# Patient Record
Sex: Female | Born: 1995 | Race: Black or African American | Hispanic: No | Marital: Single | Smoking: Never smoker
Health system: Southern US, Community
[De-identification: ages and names within clinical notes are randomized; demographics above are authoritative.]

## PROBLEM LIST (undated history)

## (undated) DIAGNOSIS — N946 Dysmenorrhea, unspecified: Secondary | ICD-10-CM

## (undated) DIAGNOSIS — Z8781 Personal history of (healed) traumatic fracture: Secondary | ICD-10-CM

## (undated) DIAGNOSIS — F419 Anxiety disorder, unspecified: Secondary | ICD-10-CM

## (undated) DIAGNOSIS — D649 Anemia, unspecified: Secondary | ICD-10-CM

## (undated) DIAGNOSIS — G43909 Migraine, unspecified, not intractable, without status migrainosus: Secondary | ICD-10-CM

## (undated) HISTORY — DX: Migraine, unspecified, not intractable, without status migrainosus: G43.909

## (undated) HISTORY — DX: Personal history of (healed) traumatic fracture: Z87.81

## (undated) HISTORY — DX: Dysmenorrhea, unspecified: N94.6

## (undated) HISTORY — DX: Anxiety disorder, unspecified: F41.9

## (undated) HISTORY — DX: Anemia, unspecified: D64.9

---
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
## 2013-01-04 ENCOUNTER — Ambulatory Visit: Payer: Medicaid Other

## 2013-01-06 ENCOUNTER — Ambulatory Visit: Payer: Medicaid Other

## 2013-01-11 ENCOUNTER — Ambulatory Visit: Payer: Medicaid Other

## 2013-01-13 ENCOUNTER — Ambulatory Visit: Payer: Medicaid Other

## 2013-01-18 ENCOUNTER — Ambulatory Visit: Payer: Medicaid Other

## 2013-01-20 ENCOUNTER — Ambulatory Visit: Payer: Medicaid Other

## 2013-01-25 ENCOUNTER — Ambulatory Visit: Payer: Medicaid Other

## 2013-01-27 ENCOUNTER — Ambulatory Visit: Payer: Medicaid Other

## 2013-06-27 ENCOUNTER — Encounter (HOSPITAL_COMMUNITY): Payer: Self-pay

## 2013-06-27 ENCOUNTER — Emergency Department (HOSPITAL_COMMUNITY): Payer: Medicaid Other

## 2013-06-27 ENCOUNTER — Emergency Department (HOSPITAL_COMMUNITY)
Admission: EM | Admit: 2013-06-27 | Discharge: 2013-06-27 | Discharge status: Against Medical Advice | Payer: Medicaid Other | Attending: Emergency Medicine | Admitting: Emergency Medicine

## 2013-06-27 DIAGNOSIS — R0602 Shortness of breath: Secondary | ICD-10-CM | POA: Insufficient documentation

## 2013-06-27 DIAGNOSIS — R079 Chest pain, unspecified: Secondary | ICD-10-CM | POA: Insufficient documentation

## 2013-06-27 LAB — BASIC METABOLIC PANEL
BUN: 14 mg/dL (ref 6–23)
CO2: 29 mEq/L (ref 19–32)
Calcium: 9.7 mg/dL (ref 8.4–10.5)
Chloride: 103 mEq/L (ref 96–112)
Creatinine, Ser: 0.73 mg/dL (ref 0.50–1.10)
GFR calc Af Amer: >90 mL/min (ref >90)
GFR calc non Af Amer: >90 mL/min (ref >90)
Glucose, Bld: 87 mg/dL (ref 70–99)
Potassium: 3.6 mEq/L (ref 3.5–5.1)
Sodium: 140 mEq/L (ref 135–145)

## 2013-06-27 LAB — POCT I-STAT TROPONIN I: Troponin i, poc: 0 ng/mL (ref 0.00–0.08)

## 2013-06-27 LAB — CBC
MCV: 91.2 fL (ref 78.0–100.0)
Platelets: 289 10*3/uL (ref 150–400)
RBC: 4.34 MIL/uL (ref 3.87–5.11)
RDW: 12 % (ref 11.5–15.5)
WBC: 8.7 10*3/uL (ref 4.0–10.5)

## 2013-06-27 NOTE — ED Notes (Signed)
Pt c/o Left side chest pain and difficulty breathing starting today. Pt denies N/V/D, cough, congestion, abd/back pain, or dizziness

## 2013-06-27 NOTE — ED Notes (Signed)
Called for pt. In lobby x 2 no response.

## 2013-07-29 HISTORY — PX: WISDOM TOOTH EXTRACTION: SHX21

## 2013-10-19 ENCOUNTER — Ambulatory Visit (INDEPENDENT_AMBULATORY_CARE_PROVIDER_SITE_OTHER): Payer: BC Managed Care – PPO | Admitting: Obstetrics and Gynecology

## 2013-10-19 ENCOUNTER — Encounter: Payer: Self-pay | Admitting: Obstetrics and Gynecology

## 2013-10-19 VITALS — BP 100/60 | HR 72 | Resp 16 | Ht 64.0 in | Wt 164.0 lb

## 2013-10-19 DIAGNOSIS — Z01419 Encounter for gynecological examination (general) (routine) without abnormal findings: Secondary | ICD-10-CM

## 2013-10-19 DIAGNOSIS — Z113 Encounter for screening for infections with a predominantly sexual mode of transmission: Secondary | ICD-10-CM

## 2013-10-19 DIAGNOSIS — N63 Unspecified lump in unspecified breast: Secondary | ICD-10-CM

## 2013-10-19 DIAGNOSIS — Z0000 Encounter for general adult medical examination without abnormal findings: Secondary | ICD-10-CM

## 2013-10-19 DIAGNOSIS — N6310 Unspecified lump in the right breast, unspecified quadrant: Secondary | ICD-10-CM

## 2013-10-19 DIAGNOSIS — N390 Urinary tract infection, site not specified: Secondary | ICD-10-CM

## 2013-10-19 LAB — POCT URINALYSIS DIPSTICK
Nitrite, UA: NEGATIVE
Urobilinogen, UA: NEGATIVE

## 2013-10-19 LAB — HEMOGLOBIN, FINGERSTICK: Hemoglobin, fingerstick: 12.6 g/dL (ref 12.0–16.0)

## 2013-10-19 MED ORDER — NORELGESTROMIN-ETH ESTRADIOL 150-35 MCG/24HR TD PTWK: 1.0000 | MEDICATED_PATCH | TRANSDERMAL | Status: DC

## 2013-10-19 MED ORDER — SULFAMETHOXAZOLE-TRIMETHOPRIM 800-160 MG PO TABS: 1.0000 | ORAL_TABLET | Freq: Two times a day (BID) | ORAL | Status: DC

## 2013-10-19 NOTE — Progress Notes (Signed)
GYNECOLOGY VISIT  PCP: none  Referring provider:   HPI: 17 y.o.   Married  [place]  female   [id number] with Patient's last menstrual period was 09/29/2013.   here for   Annual Exam, Discuss Contraception, Occ Pelvic Pain and Pressure with urination. Occurred a couple of weeks ago and comes and goes.   No known urinary tract infections in the past.   Uses condoms for contraception. No STD testing done in past.    Forgot OCPs in the past.   Wants to try the Ortho Evra patch.    Hgb:  13.1 Urine:  Pos, trace protein, trace leukocytes, blood 1+   GYNECOLOGIC HISTORY: Patient's last menstrual period was 09/29/2013. Sexually active:  yes Partner preference: female Contraception: Condoms   Menopausal hormone therapy: no DES exposure:   no Blood transfusions:   no Sexually transmitted diseases:   no GYN Procedures:  no Mammogram:     never            Pap:   never History of abnormal pap smear:  no   OB History   Grav Para Term Preterm Abortions TAB SAB Ect Mult Living   0 0 0 0 0 0 0 0 0 0        LIFESTYLE: Exercise:  Walking 5-7 days a week             Tobacco: no  Alcohol: no Drug use:  occ marijuana use   OTHER HEALTH MAINTENANCE: Tetanus/TDap:  Less than 10 years Gardisil: has had all three Gardasil injections Influenza:  no Zostavax:no  Bone density: no Colonoscopy:no  Cholesterol check: no  Family History  Problem Relation Age of Onset  . Migraines Mother   . Stroke Paternal Grandmother     There are no active problems to display for this patient.  Past Medical History  Diagnosis Date  . Anemia   . Dysmenorrhea   . Anxiety   . History of broken nose     hairline fracture    Past Surgical History  Procedure Laterality Date  . Wisdom tooth extraction  07/2013    ALLERGIES: Review of patient's allergies indicates no known allergies.  Current Outpatient Prescriptions  Medication Sig Dispense Refill  . MELOXICAM PO Take by mouth as  needed.      . Multiple Vitamin (MULTI-VITAMIN DAILY PO) Take by mouth daily.       No current facility-administered medications for this visit.     ROS:  Pertinent items are noted in HPI.  SOCIAL HISTORY:  Conservation officer, nature. Press photographer.  Wants to study nutrition.  Wants to develop her own food line.  PHYSICAL EXAMINATION:    BP 100/60  Pulse 72  Resp 16  Ht 5\' 4"  (1.626 m)  Wt 164 lb (74.39 kg)  BMI 28.14 kg/m2  LMP 09/29/2013   Wt Readings from Last 3 Encounters:  10/19/13 164 lb (74.39 kg) (90%*, Z = 1.30)   * Growth percentiles are based on CDC 2-20 Years data.     Ht Readings from Last 3 Encounters:  10/19/13 5\' 4"  (1.626 m) (46%*, Z = -0.10)   * Growth percentiles are based on CDC 2-20 Years data.    General appearance: alert, cooperative and appears stated age Head: Normocephalic, without obvious abnormality, atraumatic Neck: no adenopathy, supple, symmetrical, trachea midline and thyroid not enlarged, symmetric, no tenderness/mass/nodules Lungs: clear to auscultation bilaterally Breasts: Inspection negative, No nipple retraction or dimpling, No nipple discharge or bleeding, No axillary or  supraclavicular adenopathy, Normal to palpation without dominant masses Heart: regular rate and rhythm Abdomen: soft, non-tender; no masses,  no organomegaly Extremities: extremities normal, atraumatic, no cyanosis or edema Skin: Skin color, texture, turgor normal. No rashes or lesions Lymph nodes: Cervical, supraclavicular, and axillary nodes normal. No abnormal inguinal nodes palpated Neurologic: Grossly normal  Pelvic: External genitalia:  no lesions              Urethra:  normal appearing urethra with no masses, tenderness or lesions              Bartholins and Skenes: normal                 Vagina: normal appearing vagina with normal color and discharge, no lesions              Cervix: normal appearance              Pap and high risk HPV testing done: no.            Bimanual  Exam:  Uterus:  uterus is normal size, shape, consistency and nontender                                      Adnexa: normal adnexa in size, nontender and no masses                                      ASSESSMENT  Right breast lump UTI Need for STD check  PLAN  Breast recheck in 6 weeks. Pap smear age 49 years old  Bactrim DS po bid for 3 days. Follow up urine culture. STD testing today. Start Ortho Evra.  See Epic orders.  Educated in use and side effects.  Breast recheck in 6 weeks. Return annually or prn   An After Visit Summary was printed and given to the patient.

## 2013-10-19 NOTE — Patient Instructions (Signed)
EXERCISE AND DIET:  We recommended that you start or continue a regular exercise program for good health. Regular exercise means any activity that makes your heart beat faster and makes you sweat.  We recommend exercising at least 30 minutes per day at least 3 days a week, preferably 4 or 5.  We also recommend a diet low in fat and sugar.  Inactivity, poor dietary choices and obesity can cause diabetes, heart attack, stroke, and kidney damage, among others.    ALCOHOL AND SMOKING:  Women should limit their alcohol intake to no more than 7 drinks/beers/glasses of wine (combined, not each!) per week. Moderation of alcohol intake to this level decreases your risk of breast cancer and liver damage. And of course, no recreational drugs are part of a healthy lifestyle.  And absolutely no smoking or even second hand smoke. Most people know smoking can cause heart and lung diseases, but did you know it also contributes to weakening of your bones? Aging of your skin?  Yellowing of your teeth and nails?  CALCIUM AND VITAMIN D:  Adequate intake of calcium and Vitamin D are recommended.  The recommendations for exact amounts of these supplements seem to change often, but generally speaking 600 mg of calcium (either carbonate or citrate) and 800 units of Vitamin D per day seems prudent. Certain women may benefit from higher intake of Vitamin D.  If you are among these women, your doctor will have told you during your visit.    PAP SMEARS:  Pap smears, to check for cervical cancer or precancers,  have traditionally been done yearly, although recent scientific advances have shown that most women can have pap smears less often.  However, every woman still should have a physical exam from her gynecologist every year. It will include a breast check, inspection of the vulva and vagina to check for abnormal growths or skin changes, a visual exam of the cervix, and then an exam to evaluate the size and shape of the uterus and  ovaries.  And after 19 years of age, a rectal exam is indicated to check for rectal cancers. We will also provide age appropriate advice regarding health maintenance, like when you should have certain vaccines, screening for sexually transmitted diseases, bone density testing, colonoscopy, mammograms, etc.   MAMMOGRAMS:  All women over 19 years old should have a yearly mammogram. Many facilities now offer a "3D" mammogram, which may cost around $50 extra out of pocket. If possible,  we recommend you accept the option to have the 3D mammogram performed.  It both reduces the number of women who will be called back for extra views which then turn out to be normal, and it is better than the routine mammogram at detecting truly abnormal areas.    COLONOSCOPY:  Colonoscopy to screen for colon cancer is recommended for all women at age 19.  We know, you hate the idea of the prep.  We agree, BUT, having colon cancer and not knowing it is worse!!  Colon cancer so often starts as a polyp that can be seen and removed at colonscopy, which can quite literally save your life!  And if your first colonoscopy is normal and you have no family history of colon cancer, most women don't have to have it again for 10 years.  Once every ten years, you can do something that may end up saving your life, right?  We will be happy to help you get it scheduled when you are ready.    Be sure to check your insurance coverage so you understand how much it will cost.  It may be covered as a preventative service at no cost, but you should check your particular policy.     Ethinyl Estradiol; Norelgestromin skin patches What is this medicine? ETHINYL ESTRADIOL;NORELGESTROMIN (ETH in il es tra DYE ole; nor el JES troe min) skin patch is used as a contraceptive (birth control method). This medicine combines two types of female hormones, an estrogen and a progestin. This patch is used to prevent ovulation and pregnancy. This medicine may be used  for other purposes; ask your health care provider or pharmacist if you have questions. What should I tell my health care provider before I take this medicine? They need to know if you have or ever had any of these conditions: -abnormal vaginal bleeding -blood vessel disease or blood clots -breast, cervical, endometrial, ovarian, liver, or uterine cancer -diabetes -gallbladder disease -heart disease or recent heart attack -high blood pressure -high cholesterol -kidney disease -liver disease -migraine headaches -stroke -systemic lupus erythematosus (SLE) -tobacco smoker -an unusual or allergic reaction to estrogens, progestins, other medicines, foods, dyes, or preservatives -pregnant or trying to get pregnant -breast-feeding How should I use this medicine? This patch is applied to the skin. Follow the directions on the prescription label. Apply to clean, dry, healthy skin on the buttock, abdomen, upper outer arm or upper torso, in a place where it will not be rubbed by tight clothing. Do not use lotions or other cosmetics on the site where the patch will go. Press the patch firmly in place for 10 seconds to ensure good contact with the skin. Change the patch every 7 days on the same day of the week for 3 weeks. You will then have a break from the patch for 1 week, after which you will apply a new patch. Do not use your medicine more often than directed. Contact your pediatrician regarding the use of this medicine in children. Special care may be needed. This medicine has been used in female children who have started having menstrual periods. A patient package insert for the product will be given with each prescription and refill. Read this sheet carefully each time. The sheet may change frequently. Overdosage: If you think you have taken too much of this medicine contact a poison control center or emergency room at once. NOTE: This medicine is only for you. Do not share this medicine with  others. What if I miss a dose? You will need to replace your patch once a week as directed. If your patch is lost or falls off, contact your health care professional for advice. You may need to use another form of birth control if your patch has been off for more than 1 day. What may interact with this medicine? -acetaminophen -antibiotics or medicines for infections, especially rifampin, rifabutin, rifapentine, and griseofulvin, and possibly penicillins or tetracyclines -aprepitant -ascorbic acid (vitamin C) -atorvastatin -barbiturate medicines, such as phenobarbital -bosentan -carbamazepine -caffeine -clofibrate -cyclosporine -dantrolene -doxercalciferol -felbamate -grapefruit juice -hydrocortisone -medicines for anxiety or sleeping problems, such as diazepam or temazepam -medicines for diabetes, including pioglitazone -modafinil -mycophenolate -nefazodone -oxcarbazepine -phenytoin -prednisolone -ritonavir or other medicines for HIV infection or AIDS -rosuvastatin -selegiline -soy isoflavones supplements -St. John's wort -tamoxifen or raloxifene -theophylline -thyroid hormones -topiramate -warfarin This list may not describe all possible interactions. Give your health care provider a list of all the medicines, herbs, non-prescription drugs, or dietary supplements you use. Also tell them if you  smoke, drink alcohol, or use illegal drugs. Some items may interact with your medicine. What should I watch for while using this medicine? Visit your doctor or health care professional for regular checks on your progress. You will need a regular breast and pelvic exam and Pap smear while on this medicine. Use an additional method of contraception during the first cycle that you use this patch. If you have any reason to think you are pregnant, stop using this medicine right away and contact your doctor or health care professional. If you are using this medicine for hormone related  problems, it may take several cycles of use to see improvement in your condition. Smoking increases the risk of getting a blood clot or having a stroke while you are using hormonal birth control, especially if you are more than 19 years old. You are strongly advised not to smoke. This medicine can make your body retain fluid, making your fingers, hands, or ankles swell. Your blood pressure can go up. Contact your doctor or health care professional if you feel you are retaining fluid. This medicine can make you more sensitive to the sun. Keep out of the sun. If you cannot avoid being in the sun, wear protective clothing and use sunscreen. Do not use sun lamps or tanning beds/booths. If you wear contact lenses and notice visual changes, or if the lenses begin to feel uncomfortable, consult your eye care specialist. In some women, tenderness, swelling, or minor bleeding of the gums may occur. Notify your dentist if this happens. Brushing and flossing your teeth regularly may help limit this. See your dentist regularly and inform your dentist of the medicines you are taking. If you are going to have elective surgery or a MRI, you may need to stop using this medicine before the surgery or MRI. Consult your health care professional for advice. This medicine does not protect you against HIV infection (AIDS) or any other sexually transmitted diseases. What side effects may I notice from receiving this medicine? Side effects that you should report to your doctor or health care professional as soon as possible: -breast tissue changes or discharge -changes in vaginal bleeding during your period or between your periods -chest pain -coughing up blood -dizziness or fainting spells -headaches or migraines -leg, arm or groin pain -severe or sudden headaches -stomach pain (severe) -sudden shortness of breath -sudden loss of coordination, especially on one side of the body -speech problems -symptoms of vaginal  infection like itching, irritation or unusual discharge -tenderness in the upper abdomen -vomiting -weakness or numbness in the arms or legs, especially on one side of the body -yellowing of the eyes or skin Side effects that usually do not require medical attention (report to your doctor or health care professional if they continue or are bothersome): -breakthrough bleeding and spotting that continues beyond the 3 initial cycles of pills -breast tenderness -mood changes, anxiety, depression, frustration, anger, or emotional outbursts -increased sensitivity to sun or ultraviolet light -nausea -skin rash, acne, or brown spots on the skin -weight gain (slight) This list may not describe all possible side effects. Call your doctor for medical advice about side effects. You may report side effects to FDA at 1-800-FDA-1088. Where should I keep my medicine? Keep out of the reach of children. Store at room temperature between 15 and 30 degrees C (59 and 86 degrees F). Keep the patch in its pouch until time of use. Throw away any unused medicine after the expiration date. Dispose of  used patches properly. Since a used patch may still contain active hormones, fold the patch in half so that it sticks to itself prior to disposal. Throw away in a place where children or pets cannot reach. NOTE: This sheet is a summary. It may not cover all possible information. If you have questions about this medicine, talk to your doctor, pharmacist, or health care provider.  2013, [employer]. (11/30/2008 12:06:24 PM)

## 2013-10-20 LAB — STD PANEL

## 2013-10-20 NOTE — Addendum Note (Signed)
Addended by: Alphonsa Overall on: 10/20/2013 08:34 AM   Modules accepted: Orders

## 2013-10-22 LAB — URINE CULTURE

## 2013-10-27 ENCOUNTER — Other Ambulatory Visit: Payer: Self-pay | Admitting: Obstetrics and Gynecology

## 2013-10-27 ENCOUNTER — Other Ambulatory Visit: Payer: Self-pay

## 2013-10-27 DIAGNOSIS — Z113 Encounter for screening for infections with a predominantly sexual mode of transmission: Secondary | ICD-10-CM

## 2013-10-27 DIAGNOSIS — Z202 Contact with and (suspected) exposure to infections with a predominantly sexual mode of transmission: Secondary | ICD-10-CM

## 2013-10-31 ENCOUNTER — Other Ambulatory Visit: Payer: BC Managed Care – PPO

## 2013-10-31 ENCOUNTER — Telehealth: Payer: Self-pay | Admitting: Obstetrics and Gynecology

## 2013-10-31 NOTE — Telephone Encounter (Signed)
Alejandra Martinez,  Patient [facility] appointment for GC/CT. We do not need to pursue this further.

## 2013-10-31 NOTE — Telephone Encounter (Signed)
Left message for pt to reschedule missed lab appt from this afternoon.

## 2013-11-03 ENCOUNTER — Other Ambulatory Visit: Payer: Self-pay

## 2013-12-02 ENCOUNTER — Ambulatory Visit (INDEPENDENT_AMBULATORY_CARE_PROVIDER_SITE_OTHER): Payer: BC Managed Care – PPO | Admitting: Obstetrics and Gynecology

## 2013-12-02 ENCOUNTER — Encounter: Payer: Self-pay | Admitting: Obstetrics and Gynecology

## 2013-12-02 VITALS — BP 110/60 | HR 60 | Ht 64.0 in | Wt 168.0 lb

## 2013-12-02 DIAGNOSIS — N63 Unspecified lump in unspecified breast: Secondary | ICD-10-CM

## 2013-12-02 DIAGNOSIS — Z113 Encounter for screening for infections with a predominantly sexual mode of transmission: Secondary | ICD-10-CM

## 2013-12-02 DIAGNOSIS — N390 Urinary tract infection, site not specified: Secondary | ICD-10-CM

## 2013-12-02 DIAGNOSIS — N6310 Unspecified lump in the right breast, unspecified quadrant: Secondary | ICD-10-CM

## 2013-12-02 NOTE — Progress Notes (Signed)
Patient ID: Alejandra Martinez, female   DOB: Aug 21, 1996, 17 y.o.   MRN: [id number]  Subjective  Patient is here for a recheck of UTI, to do GC/CT testing, and a right breast recheck.   Feels better after taking Bactrim.  Had E coli UTI on 10/19/13. No painful urination now.   Objective  Breast exam - right breast - palpable mass at 6 - 7 o'clock approx 0.75 - 1 cm, nontender.  No retractions, axillary adenopathy.                         Left breast - no masses, retractions, or axillary adenopathy.   Assessment  Persistent breast mass.  I suspect fibrocystic change. Recent UTI. Desire for STD testing to be completed.  Plan  Ultrasound of breast at [facility]. Urine culture for test of cure.  Urine GC/CT.   15 minutes face to face time of which over 50% was spent in counseling.

## 2013-12-02 NOTE — Progress Notes (Signed)
R Breast U/S Scheduled at visit 12/17 at 9 am. Patient agreeable to time/date/location for [facility] imaging

## 2013-12-04 ENCOUNTER — Encounter: Payer: Self-pay | Admitting: Obstetrics and Gynecology

## 2013-12-05 LAB — CULTURE, URINE COMPREHENSIVE: Colony Count: 50000

## 2013-12-07 ENCOUNTER — Other Ambulatory Visit: Payer: Medicaid Other

## 2013-12-14 ENCOUNTER — Ambulatory Visit
Admission: RE | Admit: 2013-12-14 | Discharge: 2013-12-14 | Disposition: A | Discharge status: Home / Self Care | Payer: BC Managed Care – PPO | Source: Ambulatory Visit | Attending: Obstetrics and Gynecology | Admitting: Obstetrics and Gynecology

## 2013-12-14 DIAGNOSIS — N6310 Unspecified lump in the right breast, unspecified quadrant: Secondary | ICD-10-CM

## 2014-04-18 ENCOUNTER — Emergency Department (INDEPENDENT_AMBULATORY_CARE_PROVIDER_SITE_OTHER)
Admission: EM | Admit: 2014-04-18 | Discharge: 2014-04-18 | Disposition: A | Discharge status: Home / Self Care | Payer: BC Managed Care – PPO | Source: Home / Self Care

## 2014-04-18 ENCOUNTER — Encounter (HOSPITAL_COMMUNITY): Payer: Self-pay | Admitting: Emergency Medicine

## 2014-04-18 ENCOUNTER — Emergency Department (INDEPENDENT_AMBULATORY_CARE_PROVIDER_SITE_OTHER): Payer: BC Managed Care – PPO

## 2014-04-18 DIAGNOSIS — S93609A Unspecified sprain of unspecified foot, initial encounter: Secondary | ICD-10-CM

## 2014-04-18 DIAGNOSIS — M79671 Pain in right foot: Secondary | ICD-10-CM

## 2014-04-18 DIAGNOSIS — M79609 Pain in unspecified limb: Secondary | ICD-10-CM

## 2014-04-18 DIAGNOSIS — S93601A Unspecified sprain of right foot, initial encounter: Secondary | ICD-10-CM

## 2014-04-18 NOTE — Discharge Instructions (Signed)
Foot Sprain The muscles and cord like structures which attach muscle to bone (tendons) that surround the feet are made up of units. A foot sprain can occur at the weakest spot in any of these units. This condition is most often caused by injury to or overuse of the foot, as from playing contact sports, or aggravating a previous injury, or from poor conditioning, or obesity. SYMPTOMS  Pain with movement of the foot.  Tenderness and swelling at the injury site.  Loss of strength is present in moderate or severe sprains. THE THREE GRADES OR SEVERITY OF FOOT SPRAIN ARE:  Mild (Grade I): Slightly pulled muscle without tearing of muscle or tendon fibers or loss of strength.  Moderate (Grade II): Tearing of fibers in a muscle, tendon, or at the attachment to bone, with small decrease in strength.  Severe (Grade III): Rupture of the muscle-tendon-bone attachment, with separation of fibers. Severe sprain requires surgical repair. Often repeating (chronic) sprains are caused by overuse. Sudden (acute) sprains are caused by direct injury or over-use. DIAGNOSIS  Diagnosis of this condition is usually by your own observation. If problems continue, a caregiver may be required for further evaluation and treatment. X-rays may be required to make sure there are not breaks in the bones (fractures) present. Continued problems may require physical therapy for treatment. PREVENTION  Use strength and conditioning exercises appropriate for your sport.  Warm up properly prior to working out.  Use athletic shoes that are made for the sport you are participating in.  Allow adequate time for healing. Early return to activities makes repeat injury more likely, and can lead to an unstable arthritic foot that can result in prolonged disability. Mild sprains generally heal in 3 to 10 days, with moderate and severe sprains taking 2 to 10 weeks. Your caregiver can help you determine the proper time required for  healing. HOME CARE INSTRUCTIONS   Apply ice to the injury for 15-20 minutes, 03-04 times per day. Put the ice in a plastic bag and place a towel between the bag of ice and your skin.  An elastic wrap (like an Ace bandage) may be used to keep swelling down.  Keep foot above the level of the heart, or at least raised on a footstool, when swelling and pain are present.  Try to avoid use other than gentle range of motion while the foot is painful. Do not resume use until instructed by your caregiver. Then begin use gradually, not increasing use to the point of pain. If pain does develop, decrease use and continue the above measures, gradually increasing activities that do not cause discomfort, until you gradually achieve normal use.  Use crutches if and as instructed, and for the length of time instructed.  Keep injured foot and ankle wrapped between treatments.  Massage foot and ankle for comfort and to keep swelling down. Massage from the toes up towards the knee.  Only take over-the-counter or prescription medicines for pain, discomfort, or fever as directed by your caregiver. SEEK IMMEDIATE MEDICAL CARE IF:   Your pain and swelling increase, or pain is not controlled with medications.  You have loss of feeling in your foot or your foot turns cold or blue.  You develop new, unexplained symptoms, or an increase of the symptoms that brought you to your caregiver. MAKE SURE YOU:   Understand these instructions.  Will watch your condition.  Will get help right away if you are not doing well or get worse. Document Released:  06/06/2002 Document Revised: 03/08/2012 Document Reviewed: 08/03/2008 [facility] Patient Information 2014 [place].  Ligament Sprain Ligaments are tough, fibrous tissues that hold bones together at the joints. A sprain can occur when a ligament is stretched. This injury may take several weeks to heal. [place] the injured area for as long  as directed by your caregiver. Then slowly start using the joint as directed by your caregiver and as the pain allows.  Keep the affected joint raised if possible to lessen swelling.  Apply ice for 15-20 minutes to the injured area every couple hours for the first half day, then 03-04 times per day for the first 48 hours. Put the ice in a plastic bag and place a towel between the bag of ice and your skin.  Wear any splinting, casting, or elastic bandage applications as instructed.  Only take over-the-counter or prescription medicines for pain, discomfort, or fever as directed by your caregiver. Do not use aspirin immediately after the injury unless instructed by your caregiver. Aspirin can cause increased bleeding and bruising of the tissues.  If you were given crutches, continue to use them as instructed and do not resume weight bearing on the affected extremity until instructed. SEEK MEDICAL CARE IF:   Your bruising, swelling, or pain increases.  You have cold and numb fingers or toes if your arm or leg was injured. SEEK IMMEDIATE MEDICAL CARE IF:   Your toes are numb or blue if your leg was injured.  Your fingers are numb or blue if your arm was injured.  Your pain is not responding to medicines and continues to stay the same or gets worse. MAKE SURE YOU:   Understand these instructions.  Will watch your condition.  Will get help right away if you are not doing well or get worse. Document Released: 12/12/2000 Document Revised: 03/08/2012 Document Reviewed: 10/10/2008 [facility] Patient Information 2014 [place].

## 2014-04-18 NOTE — ED Notes (Signed)
C/o R foot pain onset 1 week ago. No known injury. Pain on top of her foot and she said it is swollen.

## 2014-04-18 NOTE — ED Provider Notes (Signed)
Medical screening examination/treatment/procedure(s) were performed by non-physician practitioner and as supervising physician I was immediately available for consultation/collaboration.  Philipp Deputy, M.D.  Harden Mo, MD 04/18/14 [phone]

## 2014-04-18 NOTE — ED Provider Notes (Signed)
CSN: [id number]     Arrival date & time 04/18/14  1843 History   First MD Initiated Contact with Patient 04/18/14 2111     Chief Complaint  Patient presents with  . Foot Pain   (Consider location/radiation/quality/duration/timing/severity/associated sxs/prior Treatment) HPI Comments: Complains of progressive  right foot painfor one week. There is no known injury. Hurts worse with weightbearing and ambulation. Pain is located to the  Dorsal aspect of  the proximal foot.   Past Medical History  Diagnosis Date  . Anemia   . Dysmenorrhea   . Anxiety   . History of broken nose     hairline fracture   Past Surgical History  Procedure Laterality Date  . Wisdom tooth extraction  07/2013   Family History  Problem Relation Age of Onset  . Migraines Mother   . Stroke Paternal Grandmother    History  Substance Use Topics  . Smoking status: Never Smoker   . Smokeless tobacco: Never Used  . Alcohol Use: No   OB History   Grav Para Term Preterm Abortions TAB SAB Ect Mult Living   0 0 0 0 0 0 0 0 0 0      Review of Systems  Constitutional: Negative for fever.  HENT: Negative.   Respiratory: Negative.   Cardiovascular: Negative.   Musculoskeletal:       As per HPI  Skin: Negative for color change, pallor and rash.  Neurological: Negative.     Allergies  Review of patient's allergies indicates no known allergies.  Home Medications   Prior to Admission medications   Medication Sig Start Date End Date Taking? Authorizing Provider  MELOXICAM PO Take by mouth as needed.    Historical Provider, MD  Multiple Vitamin (MULTI-VITAMIN DAILY PO) Take by mouth daily.    Historical Provider, MD  norelgestromin-ethinyl estradiol (ORTHO EVRA) 150-35 MCG/24HR transdermal patch Place 1 patch onto the skin once a week. 10/19/13   Brook E Amundson de Berton Lan, MD   BP 123/80  Pulse 68  Temp(Src) 99.1 F (37.3 C) (Oral)  Resp 20  SpO2 100%  LMP 04/09/2014 Physical Exam  Nursing  note and vitals reviewed. Constitutional: She is oriented to person, place, and time. She appears well-developed and well-nourished. No distress.  HENT:  Head: Normocephalic and atraumatic.  Eyes: EOM are normal.  Neck: Normal range of motion. Neck supple.  Pulmonary/Chest: Effort normal. No respiratory distress.  Musculoskeletal:  No ankle pain or tenderness. Mild swelling and tenderness to the proximal and dorsal aspect of the foot. Nl ROM ankle. No deformity. Distal N/V M/S intact.    Neurological: She is alert and oriented to person, place, and time. No cranial nerve deficit.  Skin: Skin is warm and dry.  Psychiatric: She has a normal mood and affect.    ED Course  Procedures (including critical care time) Labs Review Labs Reviewed - No data to display   Imaging Review Dg Foot Complete Right  04/18/2014   CLINICAL DATA:  Dorsal right foot pain for 1 week.  EXAM: RIGHT FOOT COMPLETE - 3+ VIEW  COMPARISON:  None.  FINDINGS: Imaged bones, joints and soft tissues appear normal.  IMPRESSION: Normal exam.   Electronically Signed   By: Inge Rise M.D.   On: 04/18/2014 21:35     MDM   1. Right foot pain   2. Right foot sprain    ACE wrap, ice, reduce wt bearing and ambulation for a few days.  Janne Napoleon, NP 04/18/14 [place], NP 04/18/14 2214

## 2014-05-31 ENCOUNTER — Ambulatory Visit: Payer: BC Managed Care – PPO | Admitting: Obstetrics and Gynecology

## 2014-07-12 ENCOUNTER — Other Ambulatory Visit: Payer: Self-pay | Admitting: Obstetrics and Gynecology

## 2014-07-12 DIAGNOSIS — N63 Unspecified lump in unspecified breast: Secondary | ICD-10-CM

## 2014-07-18 ENCOUNTER — Ambulatory Visit
Admission: RE | Admit: 2014-07-18 | Discharge: 2014-07-18 | Disposition: A | Discharge status: Home / Self Care | Payer: 59 | Source: Ambulatory Visit | Attending: Obstetrics and Gynecology | Admitting: Obstetrics and Gynecology

## 2014-07-18 DIAGNOSIS — N63 Unspecified lump in unspecified breast: Secondary | ICD-10-CM

## 2014-08-02 ENCOUNTER — Encounter: Payer: Self-pay | Admitting: Obstetrics and Gynecology

## 2014-08-02 ENCOUNTER — Ambulatory Visit (INDEPENDENT_AMBULATORY_CARE_PROVIDER_SITE_OTHER): Payer: 59 | Admitting: Obstetrics and Gynecology

## 2014-08-02 VITALS — BP 120/64 | HR 60 | Ht 64.0 in | Wt 183.8 lb

## 2014-08-02 DIAGNOSIS — Z309 Encounter for contraceptive management, unspecified: Secondary | ICD-10-CM

## 2014-08-02 NOTE — Patient Instructions (Signed)

## 2014-08-02 NOTE — Progress Notes (Signed)
Patient ID: Alejandra Martinez, female   DOB: [birth year], 18 y.o.   MRN: [id number] GYNECOLOGY  VISIT   HPI: 18 y.o.   Married  [place] American  female   [place] with Patient's last menstrual period was 07/20/2014.   here for   Consultation regarding birth control.  Patient interested in [place] IUD, Nexplanon or Depo Provera but wants more information.   Did not like the Ortho Evra patch.  Had irregular bleeding. Off patch and has menses every 4 - 7 months.  Last for a few days only.  Heavy first day.  Can stain through clothes the first day.  Significant cramping the first day.  Ibuprofen helps.  Uses a heating pad.   Not sexually active currently.   Has a right breast fibroadenoma [facility], due in January 2016.  GYNECOLOGIC HISTORY: Patient's last menstrual period was 07/20/2014. Contraception:   condoms Menopausal hormone therapy: n/a        OB History   Grav Para Term Preterm Abortions TAB SAB Ect Mult Living   0 0 0 0 0 0 0 0 0 0          Patient Active Problem List   Diagnosis Date Noted  . Lump of right breast 10/19/2013    Past Medical History  Diagnosis Date  . Anemia   . Dysmenorrhea   . Anxiety   . History of broken nose     hairline fracture    Past Surgical History  Procedure Laterality Date  . Wisdom tooth extraction  07/2013    Current Outpatient Prescriptions  Medication Sig Dispense Refill  . Multiple Vitamin (MULTI-VITAMIN DAILY PO) Take by mouth daily.       No current facility-administered medications for this visit.     ALLERGIES: Review of patient's allergies indicates no known allergies.  Family History  Problem Relation Age of Onset  . Migraines Mother   . Stroke Paternal Grandmother     History   Social History  . Marital Status: Married    Spouse Name: N/A    Number of Children: N/A  . Years of Education: N/A   Occupational History  . Not on file.   Social History Main Topics  . Smoking status:  Never Smoker   . Smokeless tobacco: Never Used  . Alcohol Use: No     Comment: rarely  . Drug Use: No  . Sexual Activity: Yes    Partners: Male    Birth Control/ Protection: None, Condom     Comment: condoms   Other Topics Concern  . Not on file   Social History Narrative  . No narrative on file    ROS:  Pertinent items are noted in HPI.  PHYSICAL EXAMINATION:    BP 120/64  Pulse 60  Ht 5\' 4"  (1.626 m)  Wt 183 lb 12.8 oz (83.371 kg)  BMI 31.53 kg/m2  LMP 07/20/2014     General appearance: alert, cooperative and appears stated age  ASSESSMENT  Menorrhagia.  Dysmenorrhea.   PLAN Counseled on birth control options.  Will choose Depo Provera after discussion about Skyla, Mirena, Nexplanon and Depo Provera risks and benefits.  Return during the first 5 days of menses to initiate Depo Provera.  Will OK this until AEX due.  Annual exam is due in October 2015.    An After Visit Summary was printed and given to the patient.  _15_____ minutes face to face time of which over 50% was spent in counseling.

## 2014-08-18 ENCOUNTER — Telehealth: Payer: Self-pay | Admitting: Obstetrics and Gynecology

## 2014-08-18 NOTE — Telephone Encounter (Signed)
Pt says her period started and needed to call and schedule appointment.

## 2014-08-18 NOTE — Telephone Encounter (Signed)
Spoke with patient. Patient started cycle today and would like to schedule appointment for depo. Appointment scheduled for Monday 8/24 at 9:30am. Patient agreeable to date and time.  Routing to provider for final review. Patient agreeable to disposition. Will close encounter

## 2014-08-21 ENCOUNTER — Ambulatory Visit (INDEPENDENT_AMBULATORY_CARE_PROVIDER_SITE_OTHER): Payer: 59

## 2014-08-21 VITALS — BP 104/60 | HR 60 | Ht 64.0 in | Wt 180.2 lb

## 2014-08-21 DIAGNOSIS — Z3040 Encounter for surveillance of contraceptives, unspecified: Secondary | ICD-10-CM

## 2014-08-21 DIAGNOSIS — Z3009 Encounter for other general counseling and advice on contraception: Secondary | ICD-10-CM

## 2014-08-21 MED ORDER — MEDROXYPROGESTERONE ACETATE 150 MG/ML IM SUSP
150.0000 mg | Freq: Once | INTRAMUSCULAR | Status: AC
  Administered 2014-08-21: 150 mg via INTRAMUSCULAR

## 2014-08-21 NOTE — Progress Notes (Signed)
Patient given Depo Provera 150mg  in Rt. Hip without complications.  Next injection due 11-06-14 through 11-20-14. Patient tolerated injection well.

## 2014-09-12 ENCOUNTER — Encounter: Payer: Self-pay | Admitting: Obstetrics and Gynecology

## 2014-10-23 ENCOUNTER — Ambulatory Visit (INDEPENDENT_AMBULATORY_CARE_PROVIDER_SITE_OTHER): Payer: 59 | Admitting: Obstetrics and Gynecology

## 2014-10-23 ENCOUNTER — Ambulatory Visit: Payer: BC Managed Care – PPO | Admitting: Obstetrics and Gynecology

## 2014-10-23 ENCOUNTER — Encounter: Payer: Self-pay | Admitting: Obstetrics and Gynecology

## 2014-10-23 VITALS — BP 110/60 | HR 75 | Resp 20 | Ht 64.5 in | Wt 182.8 lb

## 2014-10-23 DIAGNOSIS — R312 Other microscopic hematuria: Secondary | ICD-10-CM

## 2014-10-23 DIAGNOSIS — Z01419 Encounter for gynecological examination (general) (routine) without abnormal findings: Secondary | ICD-10-CM

## 2014-10-23 DIAGNOSIS — Z0000 Encounter for general adult medical examination without abnormal findings: Secondary | ICD-10-CM

## 2014-10-23 DIAGNOSIS — R3129 Other microscopic hematuria: Secondary | ICD-10-CM

## 2014-10-23 DIAGNOSIS — Z113 Encounter for screening for infections with a predominantly sexual mode of transmission: Secondary | ICD-10-CM

## 2014-10-23 NOTE — Progress Notes (Signed)
Patient ID: Alejandra Martinez, female   DOB: 1996-10-13, 18 y.o.   MRN: [id number] 17 y.o. G0P0000 Single African AmericanF here for annual exam.    No real menses on Depo Provera.  Some slight cramps but happy with the Depo Provera.  Wants to continue with this.  Next Depo is due in about 2 weeks.  Wants a recheck of the right breast lump.  Thinks it is smaller.  Fibroadenoma by ultrasound in July 2015.   Rudene Anda is next week.   Denies dysuria.   PCP: None  No LMP recorded. Patient has had an injection.          Sexually active: Yes.   female The current method of family planning is Depo-Provera injections.    Exercising: No.  none. Smoker:  no  Health Maintenance: Pap:  Never History of abnormal Pap:   --- MMG:   -- Colonoscopy:   -- BMD:   -- TDaP:  Less than 10 years ago Screening Labs: ---, Hb today: 12.6, Urine today: Trace RBC's (asx)   reports that she has never smoked. She has never used smokeless tobacco. She reports that she drinks alcohol. She reports that she does not use illicit drugs.  Past Medical History  Diagnosis Date  . Anemia   . Dysmenorrhea   . Anxiety   . History of broken nose     hairline fracture    Past Surgical History  Procedure Laterality Date  . Wisdom tooth extraction  07/2013    Current Outpatient Prescriptions  Medication Sig Dispense Refill  . medroxyPROGESTERone (DEPO-PROVERA) 150 MG/ML injection Inject 150 mg into the muscle every 3 (three) months.      . Multiple Vitamin (MULTI-VITAMIN DAILY PO) Take by mouth daily.       No current facility-administered medications for this visit.    Family History  Problem Relation Age of Onset  . Migraines Mother   . Stroke Paternal Grandmother     ROS:  Pertinent items are noted in HPI.  Otherwise, a comprehensive ROS was negative.  Exam:   BP 110/60  Pulse 75  Resp 20  Ht 5' 4.5" (1.638 m)  Wt 182 lb 12.8 oz (82.918 kg)  BMI 30.90 kg/m2     Height: 5' 4.5" (163.8 cm)  Ht  Readings from Last 3 Encounters:  10/23/14 5' 4.5" (1.638 m) (53%*, Z = 0.07)  08/21/14 5\' 4"  (1.626 m) (46%*, Z = -0.11)  08/02/14 5\' 4"  (1.626 m) (46%*, Z = -0.11)   * Growth percentiles are based on CDC 2-20 Years data.    General appearance: alert, cooperative and appears stated age Head: Normocephalic, without obvious abnormality, atraumatic Neck: no adenopathy, supple, symmetrical, trachea midline and thyroid normal to inspection and palpation Lungs: clear to auscultation bilaterally Breasts: normal appearance, no masses or tenderness, Inspection negative, No nipple retraction or dimpling, No nipple discharge or bleeding, No axillary or supraclavicular adenopathy Heart: regular rate and rhythm Abdomen: soft, non-tender; bowel sounds normal; no masses,  no organomegaly Extremities: extremities normal, atraumatic, no cyanosis or edema Skin: Skin color, texture, turgor normal. No rashes or lesions Lymph nodes: Cervical, supraclavicular, and axillary nodes normal. No abnormal inguinal nodes palpated Neurologic: Grossly normal   Pelvic: External genitalia:  no lesions              Urethra:  normal appearing urethra with no masses, tenderness or lesions              Bartholins and Skenes:  normal                 Vagina: normal appearing vagina with normal color and discharge, no lesions              Cervix: no lesions              Pap taken: No. Bimanual Exam:  Uterus:  normal size, contour, position, consistency, mobility, non-tender              Adnexa: normal adnexa and no mass, fullness, tenderness               Rectovaginal: Confirms               Anus:  normal sphincter tone, no lesions  A:  Well Woman with normal exam History of right breast fibroadenoma. Normal breast exam.  P:   pap smear age 68.  Urine culture and STD testing.  counseled on breast self exam, STD prevention Continue Depo Provera 150 mg IM q 3 months for one year.  Right breast ultrasound due in  January 2016. return annually or prn  An After Visit Summary was printed and given to the patient.

## 2014-10-23 NOTE — Patient Instructions (Signed)

## 2014-10-24 LAB — URINE CULTURE
Colony Count: NO GROWTH
Organism ID, Bacteria: NO GROWTH

## 2014-10-24 LAB — HEMOGLOBIN, FINGERSTICK: HEMOGLOBIN, FINGERSTICK: 12.6 g/dL (ref 12.0–16.0)

## 2014-10-24 LAB — STD PANEL
HIV 1&2 Ab, 4th Generation: NONREACTIVE
Hepatitis B Surface Ag: NEGATIVE

## 2014-10-24 LAB — HEPATITIS C ANTIBODY: HCV Ab: NEGATIVE

## 2014-10-26 LAB — IPS N GONORRHOEA AND CHLAMYDIA BY PCR

## 2014-11-05 ENCOUNTER — Encounter (HOSPITAL_COMMUNITY): Payer: Self-pay | Admitting: Emergency Medicine

## 2014-11-05 ENCOUNTER — Emergency Department (HOSPITAL_COMMUNITY)
Admission: EM | Admit: 2014-11-05 | Discharge: 2014-11-05 | Disposition: A | Discharge status: Home / Self Care | Payer: 59 | Source: Home / Self Care | Attending: Emergency Medicine | Admitting: Emergency Medicine

## 2014-11-05 DIAGNOSIS — R6889 Other general symptoms and signs: Secondary | ICD-10-CM

## 2014-11-05 MED ORDER — ONDANSETRON 4 MG PO TBDP: 4.0000 mg | ORAL_TABLET | Freq: Three times a day (TID) | ORAL | Status: DC | PRN

## 2014-11-05 MED ORDER — OSELTAMIVIR PHOSPHATE 75 MG PO CAPS: 75.0000 mg | ORAL_CAPSULE | Freq: Two times a day (BID) | ORAL | Status: DC

## 2014-11-05 NOTE — ED Notes (Signed)
C/o  Fever, vomiting, body aches, sore throat, and chills.  On set yesterday.  Pt taking tylenol for fever and aches.

## 2014-11-05 NOTE — ED Provider Notes (Signed)
CSN: [id number]     Arrival date & time 11/05/14  1653 History   First MD Initiated Contact with Patient 11/05/14 1734     Chief Complaint  Patient presents with  . Fever   (Consider location/radiation/quality/duration/timing/severity/associated sxs/prior Treatment) HPI  She is a 18 year old woman here with her mom for evaluation of fever. Her symptoms started yesterday morning with a sore throat. Over the course of the day, she developed nausea, vomiting, headache, fever, body aches. She also had a small cough. Denies rhinorrhea or nasal congestion. She is able to keep fluids down. Her symptoms have gradually been worsening.  Past Medical History  Diagnosis Date  . Anemia   . Dysmenorrhea   . Anxiety   . History of broken nose     hairline fracture   Past Surgical History  Procedure Laterality Date  . Wisdom tooth extraction  07/2013   Family History  Problem Relation Age of Onset  . Migraines Mother   . Stroke Paternal Grandmother    History  Substance Use Topics  . Smoking status: Never Smoker   . Smokeless tobacco: Never Used  . Alcohol Use: Yes     Comment: rarely--1 drink per month   OB History    Gravida Para Term Preterm AB TAB SAB Ectopic Multiple Living   0 0 0 0 0 0 0 0 0 0      Review of Systems  Constitutional: Positive for fever, chills, appetite change and fatigue.  HENT: Positive for sore throat. Negative for congestion and rhinorrhea.   Respiratory: Positive for cough. Negative for shortness of breath.   Gastrointestinal: Positive for nausea and vomiting. Negative for abdominal pain and diarrhea.  Musculoskeletal: Positive for myalgias.  Neurological: Positive for headaches.    Allergies  Review of patient's allergies indicates no known allergies.  Home Medications   Prior to Admission medications   Medication Sig Start Date End Date Taking? Authorizing Provider  medroxyPROGESTERone (DEPO-PROVERA) 150 MG/ML injection Inject 150 mg into the muscle  every 3 (three) months.    Historical Provider, MD  Multiple Vitamin (MULTI-VITAMIN DAILY PO) Take by mouth daily.    Historical Provider, MD  ondansetron (ZOFRAN ODT) 4 MG disintegrating tablet Take 1 tablet (4 mg total) by mouth every 8 (eight) hours as needed for nausea or vomiting. 11/05/14   Melony Overly, MD  oseltamivir (TAMIFLU) 75 MG capsule Take 1 capsule (75 mg total) by mouth every 12 (twelve) hours. 11/05/14   Melony Overly, MD   BP 119/79 mmHg  Pulse 116  Temp(Src) 99.8 F (37.7 C) (Oral)  Resp 16  SpO2 100%  LMP 11/04/2014 Physical Exam  Constitutional: She is oriented to person, place, and time. She appears well-developed and well-nourished. She appears distressed (appears fatigued).  HENT:  Head: Normocephalic and atraumatic.  Right Ear: Tympanic membrane and external ear normal.  Left Ear: Tympanic membrane and external ear normal.  Nose: Nose normal.  Mouth/Throat: Mucous membranes are not dry. Posterior oropharyngeal edema present. No oropharyngeal exudate or posterior oropharyngeal erythema.  Eyes: Conjunctivae are normal. Right eye exhibits no discharge. Left eye exhibits no discharge.  Neck: Neck supple.  Cardiovascular: Regular rhythm, normal heart sounds and intact distal pulses.  Tachycardia present.   No murmur heard. Pulmonary/Chest: Effort normal and breath sounds normal. No respiratory distress. She has no wheezes. She has no rales.  Abdominal: Soft. Bowel sounds are normal. She exhibits no distension. There is no tenderness. There is no rebound and no guarding.  Lymphadenopathy:    She has no cervical adenopathy.  Neurological: She is alert and oriented to person, place, and time.  Skin: Skin is warm and dry. No rash noted.    ED Course  Procedures (including critical care time) Labs Review Labs Reviewed - No data to display  Imaging Review No results found.   MDM   1. Flu-like symptoms    History consistent with flulike illness. We'll treat  with Tamiflu for 5 days. Zofran as needed for nausea. Recommended alternating Tylenol and Motrin every 4 hours. Discussed importance of fluid intake. Reviewed reasons to return as in after visit summary.    Melony Overly, MD 11/05/14 [phone]

## 2014-11-05 NOTE — Discharge Instructions (Signed)
You have the flu or something similar. Take Tamiflu 1 pill twice a day for 5 days. Take zofran 1 pill every 8 hours as needed for nausea and vomiting.  This dissolves under your tongue. Alternate tylenol and ibuprofen every 4 hours. You should start to feel better in 2 days. The fatigue will take 1-2 weeks to resolve. If you cannot keep fluids down or are getting worse, please go to the emergency room.

## 2014-11-06 ENCOUNTER — Telehealth: Payer: Self-pay | Admitting: Obstetrics and Gynecology

## 2014-11-06 ENCOUNTER — Ambulatory Visit: Payer: 59

## 2014-11-06 NOTE — Telephone Encounter (Signed)
Thank you.  I will close the encounter. 

## 2014-11-06 NOTE — Telephone Encounter (Signed)
Pt canceled her appointment for DEPO today she has the flu and will call back to reschedule when she is feeling better.

## 2014-11-15 ENCOUNTER — Ambulatory Visit (INDEPENDENT_AMBULATORY_CARE_PROVIDER_SITE_OTHER): Payer: 59 | Admitting: *Deleted

## 2014-11-15 ENCOUNTER — Ambulatory Visit: Payer: 59

## 2014-11-15 VITALS — BP 100/60 | Temp 98.6°F | Resp 18 | Wt 178.0 lb

## 2014-11-15 DIAGNOSIS — Z3040 Encounter for surveillance of contraceptives, unspecified: Secondary | ICD-10-CM

## 2014-11-15 MED ORDER — MEDROXYPROGESTERONE ACETATE 150 MG/ML IM SUSP
150.0000 mg | Freq: Once | INTRAMUSCULAR | Status: AC
  Administered 2014-11-15: 150 mg via INTRAMUSCULAR

## 2014-11-15 NOTE — Progress Notes (Signed)
Patient in today for Depo Provera Injection. Patient tolerated shot well and denies any problems with injections in the past.  Last Depo 08/21/14. Patient is on time today Last AEX 10/23/14  Advised pt next Depo due 2/3- 02/14/15. Pt agreed.

## 2014-12-19 ENCOUNTER — Telehealth: Payer: Self-pay | Admitting: Obstetrics and Gynecology

## 2014-12-19 NOTE — Telephone Encounter (Signed)
I agree and have closed the encounter.

## 2014-12-19 NOTE — Telephone Encounter (Signed)
Spoke with patient. Patient states that since her second Depo injection on 11/15/14 she has had three days of bleeding. "It is like my normal period will start but only for one day and then it stops." Denies any bleeding with first Depo injection. LMP was on 8/21. First Depo injection was on 8/24. Denies any cramping or other side effects with injection. "I had nothing with the first month. I just wanted to make sure this bleeding was normal." Patient is not currently bleeding. Was bleeding yesterday.Advised patient some break through bleeding is normal with start of Depo. Advised break through bleeding is common during first months using Depo as her body is adjusting to new birth control. Advised to monitor bleeding and if bleeding increases for more than 5 days will need to call our office. Advised if develops any other symptoms or would like to try new birth control may also call our office. Patient is agreeable. Advised will send a message over to covering provider and if any further recommendations will return call. Patient is agreeable.  Routing to [place] as covering Cc: Dr.Silva

## 2014-12-19 NOTE — Telephone Encounter (Signed)
Pt does not have chart.

## 2014-12-19 NOTE — Telephone Encounter (Signed)
Left message to call Alejandra Martinez at [phone]. 

## 2014-12-19 NOTE — Telephone Encounter (Signed)
Agree with recommendations.  OK to close encounter.

## 2014-12-19 NOTE — Telephone Encounter (Signed)
Pt has questions about her birth control. She is having bleeding occurrences. "not really spotting. Its weird. it will happen one time during the day and then it will stop for a couple of days and happen again. Its just really confusing me". Pt is having cramping associated with this.  Pt states not bleeding currently, but did bleed yesterday. Please call.  bf

## 2014-12-20 NOTE — Telephone Encounter (Signed)
Spoke with patient. Advised reviewed with providers and this is common with Depo injection. Advised to monitor bleeding and if she develops increased bleeding or any new symptoms to call our office. Patient is agreeable and verbalizes understanding.  Encounter previously closed.

## 2014-12-30 ENCOUNTER — Encounter: Payer: Self-pay | Admitting: Obstetrics and Gynecology

## 2015-01-02 ENCOUNTER — Encounter: Payer: Self-pay | Admitting: Nurse Practitioner

## 2015-01-02 ENCOUNTER — Ambulatory Visit (INDEPENDENT_AMBULATORY_CARE_PROVIDER_SITE_OTHER): Payer: 59 | Admitting: Nurse Practitioner

## 2015-01-02 ENCOUNTER — Ambulatory Visit: Payer: 59 | Admitting: Certified Nurse Midwife

## 2015-01-02 VITALS — BP 100/60 | HR 64 | Ht 64.5 in | Wt 176.0 lb

## 2015-01-02 DIAGNOSIS — Z113 Encounter for screening for infections with a predominantly sexual mode of transmission: Secondary | ICD-10-CM

## 2015-01-02 NOTE — Progress Notes (Signed)
Reviewed personally.  M. Suzanne Yeni Jiggetts, MD.  

## 2015-01-02 NOTE — Patient Instructions (Addendum)

## 2015-01-02 NOTE — Progress Notes (Signed)
  18 yrs Single AA Fe G0 here for questionable STD exposure. Now has found out partner has history of HSV.  They have been together x 2 months, but only SA X 2 with use of condoms both times.    Last sexual activity about 2 weeks ago. Denies any lesions or past history of same. Patient's last menstrual period was 12/17/2014 (approximate).                                                                                                                         Contraception:The current method of family planning is Depo Provera.    Patient:She denies abnormal vaginal bleeding, discharge or unusual pelvic pain, no dysuria, frequency or hematuria. Denies new personal products.                                           General:Healthy female WDWN Affect: normal, orientation X 3  Abdomen: soft non-tender Lymph groin:Normal   Pelvic Exam:EGBUS normal. Vulva reveals no erythema, lesions or atrophic changes. Vagina normal, no lesions, polyps or discharge.  Cervix is normal, smooth pink mucosa, no friability, non tender, no CMT, no lesions, no polyps.  Uterusnormal:  Perineal area: no lesions noted  Specimens collected: GC, CHL, Affirm                   Assessment: Normal Pelvic Exam         Contraception: as noted STD Screening: as ordered     Plan :Reviewed findings of GC,Chl,HIV RPR, Hep B, HSV I / II          STD prevention reviewed.  Questions addressed.         Repeat  STD screen if needed when results are back.  Instructions given to patient             RV

## 2015-01-03 LAB — HSV(HERPES SMPLX)ABS-I+II(IGG+IGM)-BLD
HSV 1 GLYCOPROTEIN G AB, IGG: 11.46 IV — AB
HSV 2 GLYCOPROTEIN G AB, IGG: 0.27 IV
Herpes Simplex Vrs I&II-IgM Ab (EIA): 1.1 INDEX — ABNORMAL HIGH

## 2015-01-03 LAB — WET PREP BY MOLECULAR PROBE
CANDIDA SPECIES: NEGATIVE
Gardnerella vaginalis: POSITIVE — AB
TRICHOMONAS VAG: NEGATIVE

## 2015-01-03 LAB — STD PANEL
HIV: NONREACTIVE
Hepatitis B Surface Ag: NEGATIVE

## 2015-01-04 ENCOUNTER — Other Ambulatory Visit: Payer: Self-pay | Admitting: Nurse Practitioner

## 2015-01-04 MED ORDER — METRONIDAZOLE 0.75 % VA GEL: 1.0000 | Freq: Every day | VAGINAL | Status: DC

## 2015-01-05 ENCOUNTER — Other Ambulatory Visit: Payer: Self-pay | Admitting: *Deleted

## 2015-01-05 LAB — IPS N GONORRHOEA AND CHLAMYDIA BY PCR

## 2015-01-05 NOTE — Telephone Encounter (Signed)
Encounter opened in error. Closing encounter.

## 2015-02-05 ENCOUNTER — Ambulatory Visit (INDEPENDENT_AMBULATORY_CARE_PROVIDER_SITE_OTHER): Payer: 59 | Admitting: *Deleted

## 2015-02-05 VITALS — BP 108/60 | HR 76 | Resp 18 | Ht 64.5 in | Wt 175.0 lb

## 2015-02-05 DIAGNOSIS — Z3040 Encounter for surveillance of contraceptives, unspecified: Secondary | ICD-10-CM

## 2015-02-05 MED ORDER — MEDROXYPROGESTERONE ACETATE 150 MG/ML IM SUSP
150.0000 mg | Freq: Once | INTRAMUSCULAR | Status: AC
  Administered 2015-02-05: 150 mg via INTRAMUSCULAR

## 2015-02-05 NOTE — Progress Notes (Signed)
Patient in today for Depo Provera injection.  Last Depo 11/05/14 -on time today Last AEX 10/23/14 -current   Patient tolerated shot well and denies any problems with previous injections.  Advised next Depo due between 4/26-5/10/16. - pt verbalized understanding.

## 2015-02-21 ENCOUNTER — Other Ambulatory Visit: Payer: Self-pay | Admitting: Obstetrics and Gynecology

## 2015-02-21 ENCOUNTER — Telehealth: Payer: Self-pay | Admitting: *Deleted

## 2015-02-21 DIAGNOSIS — N644 Mastodynia: Secondary | ICD-10-CM

## 2015-02-21 NOTE — Telephone Encounter (Signed)
Called patient. Agreed to schedule [place]. Requested anytime on 03/19/15.  [facility]ed [facility] schedule pt for Rt Breast US Monday 03/19/15 @10 :20am arrive @10 :00am at [facility] on [address].    - Patient notified and agreeable with date and time.

## 2015-02-21 NOTE — Telephone Encounter (Signed)
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  Pt overdue for mammogram recall.  Due January 2016.  Follow up appointment has not been made.  Please call patient to schedule.  [facility].

## 2015-02-23 NOTE — Telephone Encounter (Signed)
Pt is scheduled and order has been faxed to [facility] by [facility].  Updating recall to 02/2015. Routing to provider for review.  Closing encounter.

## 2015-02-24 NOTE — Telephone Encounter (Signed)
Thank you for doing this recall for my patient!  [place]

## 2015-03-19 ENCOUNTER — Ambulatory Visit
Admission: RE | Admit: 2015-03-19 | Discharge: 2015-03-19 | Disposition: A | Discharge status: Home / Self Care | Payer: 59 | Source: Ambulatory Visit | Attending: Obstetrics and Gynecology | Admitting: Obstetrics and Gynecology

## 2015-03-19 DIAGNOSIS — N644 Mastodynia: Secondary | ICD-10-CM

## 2015-04-27 ENCOUNTER — Ambulatory Visit: Payer: 59

## 2015-04-27 ENCOUNTER — Ambulatory Visit (INDEPENDENT_AMBULATORY_CARE_PROVIDER_SITE_OTHER): Payer: 59 | Admitting: *Deleted

## 2015-04-27 VITALS — BP 104/70 | HR 72 | Resp 16 | Ht 64.5 in | Wt 168.0 lb

## 2015-04-27 DIAGNOSIS — Z3040 Encounter for surveillance of contraceptives, unspecified: Secondary | ICD-10-CM | POA: Diagnosis not present

## 2015-04-27 MED ORDER — MEDROXYPROGESTERONE ACETATE 150 MG/ML IM SUSP
150.0000 mg | Freq: Once | INTRAMUSCULAR | Status: AC
  Administered 2015-04-27: 150 mg via INTRAMUSCULAR

## 2015-04-27 NOTE — Progress Notes (Signed)
Patient in today for Depo Provera Injection.  Last Depo 02/05/15 - Patient is on time today. Last AEX 10/23/14 - Current.  Patient tolerated shot well and was advise next Depo due between 7/15- 7/29.

## 2015-07-13 ENCOUNTER — Ambulatory Visit: Payer: Self-pay

## 2015-07-17 ENCOUNTER — Ambulatory Visit (INDEPENDENT_AMBULATORY_CARE_PROVIDER_SITE_OTHER): Payer: 59 | Admitting: *Deleted

## 2015-07-17 VITALS — BP 100/64 | Resp 18 | Ht 64.5 in | Wt 165.0 lb

## 2015-07-17 DIAGNOSIS — Z3040 Encounter for surveillance of contraceptives, unspecified: Secondary | ICD-10-CM

## 2015-07-17 MED ORDER — MEDROXYPROGESTERONE ACETATE 150 MG/ML IM SUSP
150.0000 mg | Freq: Once | INTRAMUSCULAR | Status: AC
  Administered 2015-07-17: 150 mg via INTRAMUSCULAR

## 2015-07-17 NOTE — Progress Notes (Signed)
Patient is within Depo Provera Calender Limits  Next Depo Due between: 10/4-10/18/16 Last AEX: 10/23/14 with BS AEX Scheduled: No AEX scheduled for 2016  Patient is aware when next depo is due and that she needs to schedule next AEX.  Pt tolerated Injection well in Right Ventrogluteal Muscle.  Routed to provider for review, encounter closed.

## 2015-07-17 NOTE — Progress Notes (Signed)
Encounter reviewed by Dr. Brook Amundson C. Silva.  

## 2015-07-23 ENCOUNTER — Ambulatory Visit: Payer: Self-pay

## 2015-10-02 ENCOUNTER — Ambulatory Visit (INDEPENDENT_AMBULATORY_CARE_PROVIDER_SITE_OTHER): Payer: 59

## 2015-10-02 VITALS — BP 108/72 | HR 62 | Resp 14 | Wt 165.0 lb

## 2015-10-02 DIAGNOSIS — Z3040 Encounter for surveillance of contraceptives, unspecified: Secondary | ICD-10-CM

## 2015-10-02 MED ORDER — MEDROXYPROGESTERONE ACETATE 150 MG/ML IM SUSP
150.0000 mg | Freq: Once | INTRAMUSCULAR | Status: AC
  Administered 2015-10-02: 150 mg via INTRAMUSCULAR

## 2015-10-02 NOTE — Progress Notes (Signed)
Patient is here for Depo Provera Injection Patient is within Depo Provera Calender Limits 10/4-10/18/16 Next Depo Due between: 12/20-01/01/16 Last AEX: 10/23/24 with BS AEX Scheduled: 10/25/15 with BS  Patient is aware when next depo is due.  Pt tolerated Injection well.  Routed to provider for review, encounter closed.

## 2015-10-03 ENCOUNTER — Ambulatory Visit: Payer: 59

## 2015-10-25 ENCOUNTER — Ambulatory Visit (INDEPENDENT_AMBULATORY_CARE_PROVIDER_SITE_OTHER): Payer: 59 | Admitting: Obstetrics and Gynecology

## 2015-10-25 ENCOUNTER — Encounter: Payer: Self-pay | Admitting: Obstetrics and Gynecology

## 2015-10-25 VITALS — BP 102/82 | HR 56 | Resp 18 | Ht 64.75 in | Wt 163.0 lb

## 2015-10-25 DIAGNOSIS — Z01419 Encounter for gynecological examination (general) (routine) without abnormal findings: Secondary | ICD-10-CM | POA: Diagnosis not present

## 2015-10-25 DIAGNOSIS — Z113 Encounter for screening for infections with a predominantly sexual mode of transmission: Secondary | ICD-10-CM

## 2015-10-25 LAB — HEPATITIS C ANTIBODY: HCV AB: NEGATIVE

## 2015-10-25 LAB — STD PANEL
HIV: NONREACTIVE
Hepatitis B Surface Ag: NEGATIVE

## 2015-10-25 NOTE — Patient Instructions (Signed)

## 2015-10-25 NOTE — Progress Notes (Signed)
Patient ID: Alejandra Martinez, female   DOB: 18-Mar-1996, 19 y.o.   MRN: [id number] 18 y.o. G0P0 Single African American female here for annual exam.    Really happy with Depo Provera.  No menses.  Next injection is due around Dec. 20th.  Has appointment.   Desires pap today.  Turns 19 next week.  PCP:  None   No LMP recorded (lmp unknown). Patient has had an injection.          Sexually active: No.female  The current method of family planning is Depo-Provera injections.    Exercising: Yes.    runs 2-3x/week. 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  [facility]  Colonoscopy:  n/a BMD:   n/a  Result  n/a TDaP:  10 years ?? Screening Labs:   Urine today: unable to void   reports that she has never smoked. She has never used smokeless tobacco. She reports that she drinks about 0.6 oz of alcohol per week. She reports that she does not use illicit drugs.  Past Medical History  Diagnosis Date  . Anemia   . Dysmenorrhea   . Anxiety   . History of broken nose     hairline fracture  . Migraines     Past Surgical History  Procedure Laterality Date  . Wisdom tooth extraction  07/2013    Current Outpatient Prescriptions  Medication Sig Dispense Refill  . baclofen (LIORESAL) 10 MG tablet TAKE 1/2 - 1 TAB BY MOUTH TWICE A DAY AS NEEDED FOR HEADACHE. TAKE NO MORE THAN 2 DAYS PER WEEK.  0  . medroxyPROGESTERone (DEPO-PROVERA) 150 MG/ML injection Inject 150 mg into the muscle every 3 (three) months.    . Multiple Vitamin (MULTI-VITAMIN DAILY PO) Take by mouth daily.    Marland Kitchen zonisamide (ZONEGRAN) 25 MG capsule TAKE 2 CAPSULES BY MOUTH AT BEDTIME INCREASE AS DIRECTED  1   No current facility-administered medications for this visit.    Family History  Problem Relation Age of Onset  . Migraines Mother   . Stroke Paternal Grandmother     ROS:   Pertinent items are noted in HPI.  Otherwise, a comprehensive ROS was negative.  Exam:   BP 102/82 mmHg  Pulse 56  Resp 18  Ht 5' 4.75" (1.645 m)  Wt 163 lb (73.936 kg)  BMI 27.32 kg/m2  LMP  (LMP Unknown)    General appearance: alert, cooperative and appears stated age Head: Normocephalic, without obvious abnormality, atraumatic Neck: no adenopathy, supple, symmetrical, trachea midline and thyroid normal to inspection and palpation Lungs: clear to auscultation bilaterally Breasts: normal appearance, no masses or tenderness, Inspection negative, No nipple retraction or dimpling, No nipple discharge or bleeding, No axillary or supraclavicular adenopathy Heart: regular rate and rhythm Abdomen: soft, non-tender; bowel sounds normal; no masses,  no organomegaly Extremities: extremities normal, atraumatic, no cyanosis or edema Skin: Skin color, texture, turgor normal. No rashes or lesions Lymph nodes: Cervical, supraclavicular, and axillary nodes normal. No abnormal inguinal nodes palpated Neurologic: Grossly normal  Pelvic: External genitalia:  no lesions              Urethra:  normal appearing urethra with no masses, tenderness or lesions              Bartholins and Skenes: normal  Vagina: normal appearing vagina with normal color and discharge, no lesions              Cervix: no lesions              Pap taken: Yes.   Bimanual Exam:  Uterus:  normal size, contour, position, consistency, mobility, non-tender              Adnexa: normal adnexa and no mass, fullness, tenderness              Rectovaginal: No..    Chaperone was present for exam.  Assessment:   Well woman visit with normal exam. Hx right breast fibroadenoma.  Depo Provera patient.  Desire for STD testing.   Plan: Yearly mammogram recommended after age 20.  Recommended self breast exam.  Pap and HR HPV as above. Discussed Calcium, Vitamin D, regular exercise program including cardiovascular and  weight bearing exercise. Labs performed.  Yes.  .   See orders.  STD testing.  Refills given on medications.  Yes.  .  See orders.  OK for Depo Provera 150 mg IM q 3 months for one year.  Breast ultrasound in March 2017.  Follow up annually and prn.      After visit summary provided.

## 2015-10-27 LAB — IPS N GONORRHOEA AND CHLAMYDIA BY PCR

## 2015-10-31 LAB — IPS PAP TEST WITH REFLEX TO HPV

## 2015-11-05 ENCOUNTER — Telehealth: Payer: Self-pay | Admitting: Emergency Medicine

## 2015-11-05 DIAGNOSIS — R87810 Cervical high risk human papillomavirus (HPV) DNA test positive: Principal | ICD-10-CM

## 2015-11-05 DIAGNOSIS — R87610 Atypical squamous cells of undetermined significance on cytologic smear of cervix (ASC-US): Secondary | ICD-10-CM

## 2015-11-05 MED ORDER — METRONIDAZOLE 500 MG PO TABS: 500.0000 mg | ORAL_TABLET | Freq: Two times a day (BID) | ORAL | Status: DC

## 2015-11-05 NOTE — Telephone Encounter (Signed)
Returned call

## 2015-11-05 NOTE — Telephone Encounter (Signed)
Message left to return call to [place] at [phone].   Currently on Depo Provera.

## 2015-11-05 NOTE — Telephone Encounter (Signed)
Patient returning call.

## 2015-11-05 NOTE — Telephone Encounter (Signed)
-----   Message from Nunzio Cobbs, MD sent at 11/01/2015  9:26 AM EDT ----- Please inform patient of her pap showing ASCUS-H and positive HR HPV.  She needs a colposcopy with me.  I have not placed an order as the patient needs to be informed of the abnormal pap.  The pap also showed bacterial vaginosis.  I am recommending Flagyl 500 mg po bid for 7 days. Please send to the pharmacy of choice. She will need to wait at least 2 weeks prior to doing colposcopy.   Cc - Marisa Sprinkles

## 2015-11-05 NOTE — Telephone Encounter (Signed)
Patient notified of message from Dr. Quincy Simmonds. Patient notified of HPV results/Pap smear results and bacterial vaginosis results.   She is agreeable to scheduling colposcopy.  Brief description of procedure given to patient.  Colposcopy pre-procedure instructions given. Discussed menses and need to not have any bleeding on day of appointment, advised to call to reschedule if starts cycle. Patient on  Depo Provera.  Make sure to eat a meal and hydrate before appointment.  Advised 800 mg of Motrin PO with food one hour prior to appointment.   Patient verbalized understanding of preprocedure instructions and will call to reschedule if will be on menses or has any concerns regarding pregnancy.  Patient is advised she will be contacted with insurance coverage information. cc Kerry Hough  Instructions given regarding Flagyl. Avoid drinking alcohol. Avoid alcohol for at least 24 hours after the last dose. Drinking alcohol or taking products that have alcohol, such as cough syrup, may cause cramps, upset stomach, headaches, and flushing. Patient verbalized understanding. Will start flagyl today as she understands she must have two weeks before colposcopy and wants to schedule on a Monday afternoon. Colposcopy scheduled for 11/19/15 at 1500 with Dr. Quincy Simmonds.  Routing to provider for final review. Patient agreeable to disposition. Will close encounter.

## 2015-11-07 ENCOUNTER — Telehealth: Payer: Self-pay | Admitting: Obstetrics and Gynecology

## 2015-11-07 NOTE — Telephone Encounter (Signed)
Patient called wants to reschedule her Colposcopy for 11/19/2015 with Dr. Talbert Nan to a Wednesday if possible due to her work schedule and her now working on Mondays. Best # to reach [phone]

## 2015-11-07 NOTE — Telephone Encounter (Signed)
Spoke with patient. Patient would like to reschedule appointment for her colposcopy to a Wednesday due to current work schedule. Currently on Depo for birth control. Appointment rescheduled to 11/14/2015 at 3 pm with Dr.Silva. Agreeable to date and time.  Instructions given. Motrin 800 mg po x , one hour before appointment with food. Make sure to eat a meal before appointment and drink plenty of fluids. Patient verbalized understanding and will call to reschedule if will be on menses or has any concerns regarding pregnancy. Patient agreeable and verbalized understanding of all instructions.   Routing to provider for final review. Patient agreeable to disposition. Will close encounter.

## 2015-11-09 ENCOUNTER — Telehealth: Payer: Self-pay | Admitting: Obstetrics and Gynecology

## 2015-11-09 NOTE — Telephone Encounter (Signed)
Spoke with patient to review benefit for colposcopy scheduled 11/14/15. Patient understands benefit but has concerns and states she will not be able to keep appointment. Patient offered payment options. Unable to accept at this time. Patient agreeable to not cancel appointment at this time until reviewed by clinical nurse. Separate staff message to administrator. Patient agreeable to return call Monday 11/12/15.  Routing to clinical for review.

## 2015-11-12 NOTE — Telephone Encounter (Signed)
Called patient to discuss approved options by administrator. Left voicemail to return call.

## 2015-11-13 ENCOUNTER — Telehealth: Payer: Self-pay | Admitting: Obstetrics and Gynecology

## 2015-11-13 NOTE — Telephone Encounter (Signed)
Patient cancelled procedure scheduled for 11/14/15 due to work. Patient states she should be able to reschedule within next two weeks. Order returned to [facility] [place] for follow up in the next week.  Routing to provider for review.

## 2015-11-13 NOTE — Telephone Encounter (Signed)
Thank you for keeping patient in the work queue. She has ASCUS-H pap and positive HR HPV.   She will need to be placed in recall for December 2016 to be sure she follows through.  I will send this also to Gay Filler to be sure she is placed in recall as a back up.   Cc- Lamont Snowball

## 2015-11-13 NOTE — Telephone Encounter (Signed)
Spoke with patient. Provided benefit information. Patient agreeable. Reviewed arrival date/time. Patient agreeable. No further questions. Ok to close.

## 2015-11-14 ENCOUNTER — Ambulatory Visit: Payer: 59 | Admitting: Obstetrics and Gynecology

## 2015-11-19 ENCOUNTER — Ambulatory Visit: Payer: 59 | Admitting: Obstetrics and Gynecology

## 2015-12-14 ENCOUNTER — Telehealth: Payer: Self-pay | Admitting: *Deleted

## 2015-12-14 NOTE — Telephone Encounter (Signed)
See previous phone encounter.  Patient canceled colpo scheduled for 11-14-15 and saud she would reschedule within 2 weeks. She is scheduled for Depo injection on 12-18-15. Call to patient and left message to return call to nursing supervisor.

## 2015-12-14 NOTE — Telephone Encounter (Signed)
See next phone encounter. Encounter closed.   

## 2015-12-18 ENCOUNTER — Ambulatory Visit (INDEPENDENT_AMBULATORY_CARE_PROVIDER_SITE_OTHER): Payer: 59 | Admitting: Obstetrics and Gynecology

## 2015-12-18 ENCOUNTER — Encounter: Payer: Self-pay | Admitting: Obstetrics and Gynecology

## 2015-12-18 VITALS — BP 98/60 | HR 68 | Resp 16 | Ht 64.7 in | Wt 162.0 lb

## 2015-12-18 DIAGNOSIS — Z3009 Encounter for other general counseling and advice on contraception: Secondary | ICD-10-CM

## 2015-12-18 DIAGNOSIS — IMO0002 Reserved for concepts with insufficient information to code with codable children: Secondary | ICD-10-CM

## 2015-12-18 DIAGNOSIS — R87611 Atypical squamous cells cannot exclude high grade squamous intraepithelial lesion on cytologic smear of cervix (ASC-H): Secondary | ICD-10-CM | POA: Diagnosis not present

## 2015-12-18 MED ORDER — MEDROXYPROGESTERONE ACETATE 150 MG/ML IM SUSP
150.0000 mg | Freq: Once | INTRAMUSCULAR | Status: AC
  Administered 2015-12-18: 150 mg via INTRAMUSCULAR

## 2015-12-18 NOTE — Progress Notes (Signed)
Patient ID: Alejandra Martinez, female   DOB: [birth year], 19 y.o.   MRN: [id number]   GYNECOLOGY  VISIT   HPI: 19 y.o.   Single  African American  female   G0P0 with No LMP recorded. Patient has had an injection.   here for   Depo Provera injection.   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  Has not followed up for colposcopy to date and has declined care through our office.  GYNECOLOGIC HISTORY: No LMP recorded. Patient has had an injection.          OB History    Gravida Para Term Preterm AB TAB SAB Ectopic Multiple Living   0                  Patient Active Problem List   Diagnosis Date Noted  . Lump of right breast 10/19/2013    Past Medical History  Diagnosis Date  . Anemia   . Dysmenorrhea   . Anxiety   . History of broken nose     hairline fracture  . Migraines     Past Surgical History  Procedure Laterality Date  . Wisdom tooth extraction  07/2013    Current Outpatient Prescriptions  Medication Sig Dispense Refill  . baclofen (LIORESAL) 10 MG tablet TAKE 1/2 - 1 TAB BY MOUTH TWICE A DAY AS NEEDED FOR HEADACHE. TAKE NO MORE THAN 2 DAYS PER WEEK.  0  . medroxyPROGESTERone (DEPO-PROVERA) 150 MG/ML injection Inject 150 mg into the muscle every 3 (three) months.    . metroNIDAZOLE (FLAGYL) 500 MG tablet Take 1 tablet (500 mg total) by mouth 2 (two) times daily. 14 tablet 0  . Multiple Vitamin (MULTI-VITAMIN DAILY PO) Take by mouth daily.    Marland Kitchen zonisamide (ZONEGRAN) 25 MG capsule TAKE 2 CAPSULES BY MOUTH AT BEDTIME INCREASE AS DIRECTED  1   No current facility-administered medications for this visit.     ALLERGIES: Review of patient's allergies indicates no known allergies.  Family History  Problem Relation Age of Onset  . Migraines Mother   . Stroke Paternal Grandmother     Social History   Social History  . Marital Status: Single    Spouse Name: N/A  . Number of Children: N/A  . Years of Education: N/A   Occupational History  . Not on  file.   Social History Main Topics  . Smoking status: Never Smoker   . Smokeless tobacco: Never Used  . Alcohol Use: 0.6 oz/week    1 Standard drinks or equivalent per week     Comment: rarely--1 drink per month  . Drug Use: No  . Sexual Activity:    Partners: Male    Birth Control/ Protection: Injection     Comment: Depo Provera Injection   Other Topics Concern  . Not on file   Social History Narrative    ROS:  Pertinent items are noted in HPI.  PHYSICAL EXAMINATION:    BP 98/60 mmHg  Pulse 68  Resp 16  Ht 5' 4.7" (1.643 m)  Wt 162 lb (73.483 kg)  BMI 27.22 kg/m2    General appearance: alert, cooperative and appears stated age    ASSESSMENT  Depo Provera patient.  HGSIL pap and positive HR HPV.   PLAN  Counseled regarding abnormal pap and recommendation for follow up colposcopy and biopsy.  Understands that evaluation is needed to rule out cancerous lesion. May need referral to outside clinic.  Depo Provera injection today. Last  Depo 10-02-15  - Patient is on time today. Last AEX 10/02/15- Current.  Patient tolerated shot well and was advise next Depo due between 03/04/16-03/18/16.    An After Visit Summary was printed and given to the patient.  ___5___ minutes face to face time of which over 50% was spent in counseling.

## 2015-12-18 NOTE — Telephone Encounter (Signed)
Patient in office for Depo injection and Dr Quincy Simmonds speaking with patient directly.  Routing to provider for final review.  Will close encounter.

## 2015-12-19 ENCOUNTER — Telehealth: Payer: Self-pay | Admitting: Obstetrics and Gynecology

## 2015-12-19 ENCOUNTER — Encounter: Payer: Self-pay | Admitting: *Deleted

## 2015-12-19 ENCOUNTER — Encounter: Payer: Self-pay | Admitting: Obstetrics and Gynecology

## 2015-12-19 DIAGNOSIS — IMO0002 Reserved for concepts with insufficient information to code with codable children: Secondary | ICD-10-CM

## 2015-12-19 DIAGNOSIS — R87810 Cervical high risk human papillomavirus (HPV) DNA test positive: Secondary | ICD-10-CM

## 2015-12-19 NOTE — Telephone Encounter (Signed)
Letter to your office for review. Can be mailed if patient does not call back tomorrow as she indicated she would. Please advise.

## 2015-12-19 NOTE — Telephone Encounter (Signed)
Call to [facility]. Referral placed to outpatient [facility].  They will contact patient after reviewing referral to evaluate for eligibility of services.

## 2015-12-19 NOTE — Telephone Encounter (Signed)
I have reviewed the message below as per Lamont Snowball, RN and I confirm I was present for this conversation and agree with discussion as documented.

## 2015-12-19 NOTE — Telephone Encounter (Signed)
Patient returned call and she is advised that she is on conference call with myself, Karen Chafe, RN and Practice administrator Thayer Ohm. Patient verbalizes understanding and is agreeable.  She is advised by Karen Chafe, RN of options that were researched for her options of care for colposcopy for evaluation of her Pap smear results on 10/25/15. She is advised that we have contacted the [facility] hospital clinic, [facility], [facility], and the Breast Cancer/Cervical Cancer Prevention office for [facility] hospital. Advised that with information obtained, since patient has private insurance policy that each of those providers will file her insurance and bill for procedure, as our office would. Per Gabriel Cirri at Breast Cancer[facility]Cervical Cancer Prevention clinic, it may be best option for her to have procedure here in office due to cost discount with insurance plan.  After providing patient with this information, I advised patient that we are working to ensure she receive the care that she needs and that we would like to offer her to have an extended payment plan at our office so that she can have colposcopy in our office.  Patient's tone became elevated and states "I get that this is important, but you all don't seem to understand that I cannot afford this. I am 19, I live on my own and I work. I can't pay anything. I have other bills."  Patient is offered by Thayer Ohm a payment plan of $10.00 per month.  Patient states "I don't know what to tell you all, I just can't do it. I can't pay 10 dollars a month for the next five years." She is advised that that is not what we want for her, just to get the evaluation that she needs.  Patient is then advised by me of the risks of continuing to delay further evaluation. She is advised of risk not proceeding with colposcopy to rule out any further cervical disorders, up to and including, cervical cancer.  Delay in diagnosis of any  cervical disorder can result in more advanced disease requiring more aggressive treatment, loss of future fertility, hysterectomy and/or even result in death. Advised we are not trying to scare her, only trying to be sure she understands the significance of her results and the implications of not proceeding with colposcopy. Asked patient if her mother was an option to help her. Since she is on her mothers insurance plan, possibly mother would help. Patient states her mother doesn't think she needs the procedure.    Patient then verbalizes that she understands her risks and states that "Every woman in my family has had abnormal pap smears and this problem and nothing ever happened to them. My Mother thinks I don't need to do this."  She is advised again that we are ensuring she understands her risks and that we are trying our best to ensure she gets the follow up care that is ordered by Dr. Quincy Simmonds.  Patient states she can no longer talk on the phone and that she is at work. She states she will call our office tomorrow. She is advised of my name and title and that she can call our office any time with any concerns and she verbalized understanding. Patient then disconnected the line.   Routing to Celesta Funderburk for review.

## 2015-12-19 NOTE — Telephone Encounter (Signed)
I have signed the letter for you to send if she does not call back tomorrow.

## 2015-12-19 NOTE — Telephone Encounter (Signed)
Follow-up phone call to patient regarding referral information. Left message to call back to [facility].

## 2015-12-20 NOTE — Telephone Encounter (Signed)
No patient response today. Letter to be mailed regular and certified mail in am. Encounter closed.

## 2015-12-21 ENCOUNTER — Telehealth: Payer: Self-pay | Admitting: Obstetrics and Gynecology

## 2015-12-21 NOTE — Telephone Encounter (Signed)
Certified letter has been mailed since patient did not call yesterday. See previous encounter. Patient is on Depo for contraception. Call to patient. Patient states she is desires to proceed with scheduling colpo. Advised that certified letter will be arriving and that as long as she keep appointment, conditions of letter will be met.  Colpo scheduled for 01-07-16 at 0900. Instructed to take Motrin 800 mg one hour prior with food.   Routing to Provider for final review. Dr Talbert Nan covering for Dr Quincy Simmonds.

## 2015-12-21 NOTE — Telephone Encounter (Signed)
Patient is ready to schedule her colposcopy. Last seen 12/18/15.

## 2015-12-26 ENCOUNTER — Telehealth: Payer: Self-pay | Admitting: Obstetrics and Gynecology

## 2015-12-26 NOTE — Telephone Encounter (Signed)
Attempted call to patient to review benefit for colposcopy scheduled 01/07/16. Left voicemail for return call.

## 2016-01-02 ENCOUNTER — Encounter: Payer: Self-pay | Admitting: Obstetrics & Gynecology

## 2016-01-07 ENCOUNTER — Encounter: Payer: Self-pay | Admitting: Obstetrics and Gynecology

## 2016-01-07 ENCOUNTER — Ambulatory Visit (INDEPENDENT_AMBULATORY_CARE_PROVIDER_SITE_OTHER): Payer: 59 | Admitting: Obstetrics and Gynecology

## 2016-01-07 VITALS — BP 122/88 | HR 80 | Ht 64.7 in | Wt 162.8 lb

## 2016-01-07 DIAGNOSIS — R87610 Atypical squamous cells of undetermined significance on cytologic smear of cervix (ASC-US): Secondary | ICD-10-CM | POA: Diagnosis not present

## 2016-01-07 DIAGNOSIS — R87810 Cervical high risk human papillomavirus (HPV) DNA test positive: Secondary | ICD-10-CM

## 2016-01-07 DIAGNOSIS — R87611 Atypical squamous cells cannot exclude high grade squamous intraepithelial lesion on cytologic smear of cervix (ASC-H): Secondary | ICD-10-CM | POA: Diagnosis not present

## 2016-01-07 NOTE — Patient Instructions (Signed)
Colposcopy  Colposcopy is a procedure to examine your cervix and vagina, or the area around the outside of your vagina, for abnormalities or signs of disease. The procedure is done using a lighted microscope called a colposcope. Tissue samples may be collected during the colposcopy if your health care provider finds any unusual cells. A colposcopy may be done if a woman has:  · An abnormal Pap test. A Pap test is a medical test done to evaluate cells that are on the surface of the cervix.  · A Pap test result that is suggestive of human papillomavirus (HPV). This virus can cause genital warts and is linked to the development of cervical cancer.  · A sore on her cervix and the results of a Pap test were normal.  · Genital warts on the cervix or in or around the outside of the vagina.  · A mother who took the drug diethylstilbestrol (DES) while pregnant.  · Painful intercourse.  · Vaginal bleeding, especially after sexual intercourse.  LET YOUR HEALTH CARE PROVIDER KNOW ABOUT:  · Any allergies you have.  · All medicines you are taking, including vitamins, herbs, eye drops, creams, and over-the-counter medicines.  · Previous problems you or members of your family have had with the use of anesthetics.  · Any blood disorders you have.  · Previous surgeries you have had.  · Medical conditions you have.  RISKS AND COMPLICATIONS  Generally, a colposcopy is a safe procedure. However, as with any procedure, complications can occur. Possible complications include:  · Bleeding.  · Infection.  · Missed lesions.  BEFORE THE PROCEDURE   · Tell your health care provider if you have your menstrual period. A colposcopy typically is not done during menstruation.  · For 24 hours before the colposcopy, do not:    Douche.    Use tampons.    Use medicines, creams, or suppositories in the vagina.    Have sexual intercourse.  PROCEDURE   During the procedure, you will be lying on your back with your feet in foot rests (stirrups). A warm  metal or plastic instrument (speculum) will be placed in your vagina to keep it open and to allow the health care provider to see the cervix. The colposcope will be placed outside the vagina. It will be used to magnify and examine the cervix, vagina, and the area around the outside of the vagina. A small amount of liquid solution will be placed on the area that is to be viewed. This solution will make it easier to see the abnormal cells. Your health care provider will use tools to suck out mucus and cells from the canal of the cervix. Then he or she will record the location of the abnormal areas.  If a biopsy is done during the procedure, a medicine will usually be given to numb the area (local anesthetic). You may feel mild pain or cramping while the biopsy is done. After the procedure, tissue samples collected during the biopsy will be sent to a lab for analysis.  AFTER THE PROCEDURE   You will be given instructions on when to follow up with your health care provider for your test results. It is important to keep your appointment.     This information is not intended to replace advice given to you by your health care provider. Make sure you discuss any questions you have with your health care provider.     Document Released: 03/07/2003 Document Revised: 08/17/2013 Document Reviewed: 07/14/2013    Elsevier Interactive Patient Education ©2016 Elsevier Inc.    Colposcopy  Care After  Colposcopy is a procedure in which a special tool is used to magnify the surface of the cervix. A tissue sample (biopsy) may also be taken. This sample will be looked at for cervical cancer or other problems. After the test:  · You may have some cramping.  · Lie down for a few minutes if you feel lightheaded.  ·  You may have some bleeding which should stop in a few days.  HOME CARE  · Do not have sex or use tampons for 2 to 3 days or as told.  · Only take medicine as told by your doctor.  · Continue to take your birth control pills as  usual.  Finding out the results of your test  Ask when your test results will be ready. Make sure you get your test results.  GET HELP RIGHT AWAY IF:  · You are bleeding a lot or are passing blood clots.  · You develop a fever of 102° F (38.9° C) or higher.  · You have abnormal vaginal discharge.  · You have cramps that do not go away with medicine.  · You feel lightheaded, dizzy, or pass out (faint).  MAKE SURE YOU:   · Understand these instructions.  · Will watch your condition.  · Will get help right away if you are not doing well or get worse.     This information is not intended to replace advice given to you by your health care provider. Make sure you discuss any questions you have with your health care provider.     Document Released: 06/02/2008 Document Revised: 03/08/2012 Document Reviewed: 07/14/2013  Elsevier Interactive Patient Education ©2016 Elsevier Inc.

## 2016-01-07 NOTE — Progress Notes (Signed)
Subjective:     Patient ID: Alejandra Martinez, female   DOB: 08/21/1996, 20 y.o.   MRN: [id number]  HPI  Patient here today for colposcopy with pap smear dated 10-25-15 of ASCUS-H:Pos HR HPV.  Mother present for the entire visit today.  Patient requests for the patient's mother to be in the room.  Review of Systems  LMP: no cycles due to Depo Provera injections Contraception: Depo Provera Injection     Objective:   Physical Exam  Genitourinary:     Colposcopy of cervix and vagina with biopsy and ECC. Consent for procedure.  Speculum placed in vagina.  Acetic acid 3% in vagina.  Satisfactory colposcopy.  Rim of acetowhite change all around the cervix.  Thickened acetowhite change at 12:00 and mosaics noted at 5:00.  ECC taken and sent to path. Biopsy of exocervix at 5:00 and sent to path.  Biopsy of exocervix at 12:00 and sent to path.  Monsel's placed.  Minimal EBL.  No complications.  Tolerated well.    Assessment:     ASCUS-H.  Positive HR HPV.     Plan:     Discussion of abnormal paps, HRV, cervical dysplasia, and LEEP procedure in verbal and written form.  Follow up biopsy results.  Instructions and precautions following colposcopy to patient.   After visit summary to patient.

## 2016-01-08 ENCOUNTER — Encounter: Payer: Self-pay | Admitting: Obstetrics and Gynecology

## 2016-01-10 LAB — IPS OTHER TISSUE BIOPSY

## 2016-01-14 ENCOUNTER — Telehealth: Payer: Self-pay | Admitting: Emergency Medicine

## 2016-01-14 DIAGNOSIS — IMO0002 Reserved for concepts with insufficient information to code with codable children: Secondary | ICD-10-CM

## 2016-01-14 NOTE — Telephone Encounter (Signed)
Spoke with patient and message regarding results from Dr. Quincy Simmonds given to patient.  Pap smear and colposcopy appointment scheduled for 06/26/16 at 1500 with Dr. Quincy Simmonds.  Patient aware of instructions and advised to call with any insurance changes.  06 recall entered for 06/2016.  Patient verbalized understanding of importance of follow up in 6 months as ordered by Dr. Quincy Simmonds.  Routing to provider for final review. Patient agreeable to disposition. Will close encounter.

## 2016-01-14 NOTE — Telephone Encounter (Signed)
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  By ASCCP guidelines, she will need pap and colposcopy in 6 months due to the ASCUS-H pap preceding this colposcopy. Please enter pap recall for July 2017.   Cc- Marisa Sprinkles

## 2016-01-24 ENCOUNTER — Encounter: Payer: 59 | Admitting: Obstetrics & Gynecology

## 2016-03-04 ENCOUNTER — Telehealth: Payer: Self-pay | Admitting: Obstetrics and Gynecology

## 2016-03-04 ENCOUNTER — Ambulatory Visit (INDEPENDENT_AMBULATORY_CARE_PROVIDER_SITE_OTHER): Payer: 59

## 2016-03-04 VITALS — BP 122/70 | HR 76 | Resp 14 | Ht 64.5 in | Wt 158.0 lb

## 2016-03-04 DIAGNOSIS — Z3040 Encounter for surveillance of contraceptives, unspecified: Secondary | ICD-10-CM | POA: Diagnosis not present

## 2016-03-04 MED ORDER — MEDROXYPROGESTERONE ACETATE 150 MG/ML IM SUSP
150.0000 mg | Freq: Once | INTRAMUSCULAR | Status: AC
  Administered 2016-03-04: 150 mg via INTRAMUSCULAR

## 2016-03-04 NOTE — Telephone Encounter (Signed)
Spoke with patient. Patient received her Depo injection today in office. States she does not wish to continue with Depo moving forward. "I have not had a cycle in a while and would like to take some time off from using the Depo." Advised this is okay and she will be covered for birth control until 06/03/2016. After that date she will need to use a BUM for contraception coverage. She is agreeable. Advised she will need to monitor her cycles after coming off of Depo. If bleeding is prolonged past 7 days or is heavy where she is having to change her pad.tapmon every hour due to soaking through she will need to contact the office. She is agreeable and verbalizes understanding.  Routing to provider for final review. Patient agreeable to disposition. Will close encounter.

## 2016-03-04 NOTE — Telephone Encounter (Signed)
Patient was seen Depo today and did not wish to schedule her next Depo injection. Patient would like to discuss discontinuing Depo Provera.

## 2016-03-04 NOTE — Progress Notes (Signed)
Patient is within Depo Provera Calendar Limits Next Depo Due Between: 05/23-06/6 Last AEX: 10/25/15 AEX Scheduled: 11/14/16  Pt tolerated injection well in her Right Ventrogluteal Muscle.  Pt aware of when she should schedule her next injection and had no other questions.   Routed to provider for review, encounter closed.

## 2016-05-22 ENCOUNTER — Telehealth: Payer: Self-pay | Admitting: Obstetrics and Gynecology

## 2016-05-22 NOTE — Telephone Encounter (Signed)
Patient was scheduled for 6 month recall for pap smear and colposcopy due to prior High Grade Pap smear results.  Colposcopy was scheduled 06/26/16 at 1500 with Dr. Quincy Simmonds.    06 recall is in place for 06/2016. Also, patient in 04 recall for breast imaging that was due for recall in 01/2016.   Patient declined to schedule appointment due to note from front office.   Routing to Dr. Quincy Simmonds to review and advise. Okay to close as recalls in place?

## 2016-05-22 NOTE — Telephone Encounter (Signed)
Patient cancelled her colpo procedure and did not wish to reschedule.

## 2016-05-23 NOTE — Telephone Encounter (Signed)
All recalls will need to be closed.   [place]

## 2016-06-24 ENCOUNTER — Telehealth: Payer: Self-pay | Admitting: *Deleted

## 2016-06-24 NOTE — Telephone Encounter (Signed)
04 Recall- Right breast ultrasound in 12 months/[facility]   Please contact patient in regards to 04 recall. She is past due for her 12 month breast U/S Thanks Margaretha Sheffield

## 2016-06-26 ENCOUNTER — Ambulatory Visit: Payer: 59 | Admitting: Obstetrics and Gynecology

## 2016-06-30 ENCOUNTER — Other Ambulatory Visit: Payer: Self-pay | Admitting: Obstetrics and Gynecology

## 2016-06-30 DIAGNOSIS — N6310 Unspecified lump in the right breast, unspecified quadrant: Secondary | ICD-10-CM

## 2016-06-30 NOTE — Telephone Encounter (Signed)
Left message for patient to return call. -06/30/16 -sco  Scheduled R Breast u/s f/u for July 6th @ 9:50am for a 10:10am appt. Appt will last about 45 minutes. U/S will be done at [facility] on [facility].

## 2016-07-02 NOTE — Telephone Encounter (Signed)
Extended recall -eh 

## 2016-07-03 ENCOUNTER — Telehealth: Payer: Self-pay | Admitting: Emergency Medicine

## 2016-07-03 ENCOUNTER — Ambulatory Visit
Admission: RE | Admit: 2016-07-03 | Discharge: 2016-07-03 | Disposition: A | Discharge status: Home / Self Care | Payer: 59 | Source: Ambulatory Visit | Attending: Obstetrics and Gynecology | Admitting: Obstetrics and Gynecology

## 2016-07-03 DIAGNOSIS — N6310 Unspecified lump in the right breast, unspecified quadrant: Secondary | ICD-10-CM

## 2016-07-03 NOTE — Telephone Encounter (Signed)
Please review recalls.  She has been noncompliant in her pap/colpo recall.   Silt

## 2016-07-03 NOTE — Telephone Encounter (Signed)
Incoming call from [facility]. Patient there for recall appointment for R Breast Ultrasound follow up from 03/19/15. Order given for R Breast Ultrasound at this time.  They will send paper order for Dr. Quincy Simmonds to sign.   Routing to Dr. Quincy Simmonds. Will close.

## 2016-07-08 ENCOUNTER — Encounter: Payer: Self-pay | Admitting: *Deleted

## 2016-07-08 NOTE — Telephone Encounter (Signed)
All recalls completed. Certified letter mailed.  Routing to provider for final review.  Will close encounter.

## 2016-08-04 ENCOUNTER — Ambulatory Visit: Payer: Self-pay | Admitting: Surgery

## 2016-08-04 NOTE — H&P (Signed)
History of Present Illness Alejandra Martinez. Alejandra Cairns MD; 08/04/2016 12:34 PM) Patient words: New-cyst R breast.  The patient is a 20 year old female who presents with a breast mass. Referred by Alejandra Cabot, MD for right breast fibroadenoma  This is a 20 year old female who presents with a palpable mass in her right breast that has been followed for the last 3 years. She initially noticed this on self examination in 2014. She has had a total of 4 ultrasounds to evaluate this area. The mass seems to be either stable or slightly smaller. However the patient is concerned about the persistence of this mass and would like to have it removed. This area causes some mild tenderness. She does not have a family history of breast cancer.  Menarche age 89 Nulliparous She has been on Depo-Provera but recently stopped  CLINICAL DATA: Follow-up for probably benign fibroadenoma in the right breast.  EXAM: ULTRASOUND OF THE RIGHT BREAST  COMPARISON: Diagnostic ultrasounds dated 03/19/2015, 07/18/2014 and 12/14/2013.  FINDINGS: Targeted ultrasound is performed, again showing the oval circumscribed hypoechoic mass in the right breast at the 7 o'clock axis, 1-2 cm from the nipple (previously described as retroareolar), measuring 1.8 x 0.8 x 1.6 cm, decreased in size compared to the previous exams.  IMPRESSION: Benign right breast mass at the 7 o'clock axis, now measuring 1.8 x 0.8 x 1.6 cm, decreased in size compared to previous exams dating back more than 2 years indicating benignity, presumed fibroadenoma.  RECOMMENDATION: Screening mammogram at age 54 unless there are persistent or intervening clinical concerns. (Code:SM-B-40A)  Patient requests surgical consultation for possible excision of the benign right breast mass. Patient will be contacted by our office with appointment information.  I have discussed the findings and recommendations with the patient. Results were also provided in  writing at the conclusion of the visit. If applicable, a reminder letter will be sent to the patient regarding the next appointment.  BI-RADS CATEGORY 2: Benign Finding(s)  Electronically Signed: By: Alejandra Martinez M.D. On: 07/03/2016 11:24    Other Problems Alejandra Martinez, CMA; 08/04/2016 9:27 AM) Anxiety Disorder Migraine Headache  Past Surgical History Alejandra Martinez, CMA; 08/04/2016 9:27 AM) No pertinent past surgical history  Diagnostic Studies History Alejandra Martinez, CMA; 08/04/2016 9:27 AM) Colonoscopy never Mammogram never Pap Smear 1-5 years ago  Allergies Alejandra Martinez, CMA; 08/04/2016 9:28 AM) No Known Drug Allergies 08/04/2016  Medication History Alejandra Martinez, CMA; 08/04/2016 9:28 AM) No Current Medications Medications Reconciled  Social History Alejandra Martinez, CMA; 08/04/2016 9:27 AM) Alcohol use Occasional alcohol use. No drug use Tobacco use Never smoker.  Family History Alejandra Martinez, [place]; 08/04/2016 9:27 AM) First Degree Relatives No pertinent family history  Pregnancy / Birth History Alejandra Martinez, CMA; 08/04/2016 9:27 AM) Age at menarche 35 years. Contraceptive History Depo-provera, Oral contraceptives. Irregular periods     Review of Systems Alejandra Martinez CMA; 08/04/2016 9:27 AM) General Not Present- Appetite Loss, Chills, Fatigue, Fever, Night Sweats, Weight Gain and Weight Loss. Skin Not Present- Change in Wart/Mole, Dryness, Hives, Jaundice, New Lesions, Non-Healing Wounds, Rash and Ulcer. HEENT Present- Seasonal Allergies and Wears glasses/contact lenses. Not Present- Earache, Hearing Loss, Hoarseness, Nose Bleed, Oral Ulcers, Ringing in the Ears, Sinus Pain, Sore Throat, Visual Disturbances and Yellow Eyes. Breast Present- Breast Pain. Not Present- Breast Mass, Nipple Discharge and Skin Changes. Cardiovascular Not Present- Chest Pain, Difficulty Breathing Lying Down, Leg Cramps, Palpitations, Rapid Heart Rate, Shortness of Breath and  Swelling of Extremities. Gastrointestinal Not Present- Abdominal Pain,  Bloating, Bloody Stool, Change in Bowel Habits, Chronic diarrhea, Constipation, Difficulty Swallowing, Excessive gas, Gets full quickly at meals, Hemorrhoids, Indigestion, Nausea, Rectal Pain and Vomiting. Female Genitourinary Not Present- Frequency, Nocturia, Painful Urination, Pelvic Pain and Urgency. Neurological Not Present- Decreased Memory, Fainting, Headaches, Numbness, Seizures, Tingling, Tremor, Trouble walking and Weakness. Psychiatric Not Present- Anxiety, Bipolar, Change in Sleep Pattern, Depression, Fearful and Frequent crying. Endocrine Not Present- Cold Intolerance, Excessive Hunger, Hair Changes, Heat Intolerance, Hot flashes and New Diabetes. Hematology Not Present- Blood Thinners, Easy Bruising, Excessive bleeding, Gland problems, HIV and Persistent Infections.  Vitals (Alejandra Martinez CMA; 08/04/2016 9:28 AM) 08/04/2016 9:27 AM Weight: 168.8 lb Height: 63.5in Body Surface Area: 1.81 m Body Mass Index: 29.43 kg/m  Temp.: 98.40F(Oral)  Pulse: 75 (Regular)  BP: 110/66 (Sitting, Left Arm, Standard)      Physical Exam Alejandra Key K. Donnabelle Blanchard MD; 08/04/2016 12:36 PM)  The physical exam findings are as follows: Note:WDWN in NAD HEENT: EOMI, sclera anicteric Neck: No masses, no thyromegaly Lungs: CTA bilaterally; normal respiratory effort Breasts: bilateral nipple piercings; no axillary lymphadenopathy on either side; bilateral fibrocystic changes; Right breast - 6:00 at edge of areola - 2 cm firm palpable mass; mobile Left breast - no dominant masses CV: Regular rate and rhythm; no murmurs Abd: +bowel sounds, soft, non-tender, no masses Ext: Well-perfused; no edema Skin: Warm, dry; no sign of jaundice    Assessment & Plan Alejandra Key K. Taelynn Mcelhannon MD; 08/04/2016 9:54 AM)  BREAST FIBROADENOMA IN FEMALE, RIGHT (D24.1)  Current Plans Schedule for Surgery - Right breast lumpectomy. The surgical procedure  has been discussed with the patient. Potential risks, benefits, alternative treatments, and expected outcomes have been explained. All of the patient's questions at this time have been answered. The likelihood of reaching the patient's treatment goal is good. The patient understand the proposed surgical procedure and wishes to proceed.  Alejandra Martinez. Alejandra Dover, MD, [facility]  General/ Trauma Surgery  08/04/2016 12:37 PM

## 2016-09-12 ENCOUNTER — Encounter (HOSPITAL_COMMUNITY): Payer: Self-pay | Admitting: Emergency Medicine

## 2016-09-12 ENCOUNTER — Ambulatory Visit (HOSPITAL_COMMUNITY)
Admission: EM | Admit: 2016-09-12 | Discharge: 2016-09-12 | Disposition: A | Discharge status: Home / Self Care | Payer: 59 | Attending: Family Medicine | Admitting: Family Medicine

## 2016-09-12 DIAGNOSIS — J069 Acute upper respiratory infection, unspecified: Secondary | ICD-10-CM | POA: Diagnosis not present

## 2016-09-12 MED ORDER — PHENYLEPHRINE-CHLORPHEN-DM 10-4-12.5 MG/5ML PO LIQD: 5.0000 mL | ORAL | 0 refills | Status: DC | PRN

## 2016-09-12 NOTE — Discharge Instructions (Signed)
For nasal and head congestion may take Sudafed PE 10 mg every 4 hours as needed when not taking the medicine prescribed. Saline nasal spray used frequently and blow your nose frequently. For drainage may use Allegra or Zyrtec during the day if not taking the Rx. Ibuprofen 600 mg every 6 hours as needed for pain, discomfort or fever. Drink plenty of fluids and stay well-hydrated.

## 2016-09-12 NOTE — Pre-Procedure Instructions (Signed)
Alejandra Martinez  09/12/2016     Your procedure is scheduled on : Tuesday September 23, 2016 at 7:45 AM.  Report to [facility] Admitting at 5:30 AM.  Call this number if you have problems the morning of surgery: [phone]    Remember:  Do not eat food or drink liquids after midnight.  Take these medicines the morning of surgery with A SIP OF WATER: Acetaminophen (Tylenol) if needed   Stop taking any vitamins, herbal medications/supplements, NSAIDs, Ibuprofen, Advil, Motrin, Aleve, etc on Tuesday September 19th   Do not wear jewelry, make-up or nail polish.  Do not wear lotions, powders, or perfumes, or deoderant.  Do not shave 48 hours prior to surgery.    Do not bring valuables to the hospital.  [facility] is not responsible for any belongings or valuables.  Contacts, dentures or bridgework may not be worn into surgery.  Leave your suitcase in the car.  After surgery it may be brought to your room.  For patients admitted to the hospital, discharge time will be determined by your treatment team.  Patients discharged the day of surgery will not be allowed to drive home.   Name and phone number of your driver:    Special instructions:  Shower using CHG soap the night before and the morning of your surgery  Please read over the following fact sheets that you were given. Pain Booklet

## 2016-09-12 NOTE — ED Provider Notes (Signed)
CSN: [id number]     Arrival date & time 09/12/16  11 History   First MD Initiated Contact with Patient 09/12/16 1700     Chief Complaint  Patient presents with  . URI   (Consider location/radiation/quality/duration/timing/severity/associated sxs/prior Treatment) 20 year old female complaining of a 2-3 day history of upper rest for congestion, nasal congestion, mild headache, sore throat and PND. Denies earache, fever or shortness of breath.      Past Medical History:  Diagnosis Date  . Anemia   . Anxiety   . Dysmenorrhea   . History of broken nose    hairline fracture  . Migraines    Past Surgical History:  Procedure Laterality Date  . WISDOM TOOTH EXTRACTION  07/2013   Family History  Problem Relation Age of Onset  . Migraines Mother   . Stroke Paternal Grandmother    Social History  Substance Use Topics  . Smoking status: Never Smoker  . Smokeless tobacco: Never Used  . Alcohol use 0.6 oz/week    1 Standard drinks or equivalent per week     Comment: rarely--1 drink per month   OB History    Gravida Para Term Preterm AB Living   0             SAB TAB Ectopic Multiple Live Births                 Review of Systems  Constitutional: Positive for activity change. Negative for fever.       Noted in HPI otherwise Negative  HENT: Positive for congestion, postnasal drip, sinus pressure and sneezing. Negative for ear pain.        Noted in HPI, otherwise negative  Eyes: Negative for pain.  Respiratory: Positive for cough. Negative for shortness of breath.   Cardiovascular: Negative for chest pain, palpitations and leg swelling.  Gastrointestinal: Negative.   Musculoskeletal: Negative.   Skin: Negative for rash.  Neurological: Negative.   Psychiatric/Behavioral: Negative.     Allergies  Review of patient's allergies indicates no known allergies.  Home Medications   Prior to Admission medications   Medication Sig Start Date End Date Taking? Authorizing  Provider  acetaminophen (TYLENOL) 325 MG tablet Take 650 mg by mouth every 6 (six) hours as needed for mild pain.    Historical Provider, MD  ibuprofen (ADVIL,MOTRIN) 200 MG tablet Take 200 mg by mouth every 6 (six) hours as needed for moderate pain.    Historical Provider, MD  Multiple Vitamin (MULTI-VITAMIN DAILY PO) Take by mouth daily.    Historical Provider, MD  Phenylephrine-Chlorphen-DM 10-02-11.5 MG/5ML LIQD Take 5 mLs by mouth every 4 (four) hours as needed. May cause drowsiness. 09/12/16   Janne Napoleon, NP   Meds Ordered and Administered this Visit  Medications - No data to display  BP 116/85 (BP Location: Left Arm)   Pulse 62   Temp 98.4 F (36.9 C) (Oral)   Resp 16   SpO2 100%  No data found.   Physical Exam  Constitutional: She is oriented to person, place, and time. She appears well-developed and well-nourished. No distress.  HENT:  Head: Normocephalic and atraumatic.  Bilateral TMs are mildly retracted. No erythema or effusion. Oropharynx with cobblestoning, clear PND minor erythema. No exudates or swelling.  Positive for sniffling and rhinorrhea.  Neck: Normal range of motion. Neck supple.  Cardiovascular: Normal rate, regular rhythm, normal heart sounds and intact distal pulses.   Pulmonary/Chest: Effort normal and breath sounds normal. No respiratory distress.  Musculoskeletal: Normal range of motion. She exhibits no edema.  Lymphadenopathy:    She has no cervical adenopathy.  Neurological: She is alert and oriented to person, place, and time.  Skin: Skin is warm and dry. No rash noted.  Psychiatric: She has a normal mood and affect.  Nursing note and vitals reviewed.   Urgent Care Course   Clinical Course    Procedures (including critical care time)  Labs Review Labs Reviewed - No data to display  Imaging Review No results found.   Visual Acuity Review  Right Eye Distance:   Left Eye Distance:   Bilateral Distance:    Right Eye Near:   Left  Eye Near:    Bilateral Near:         MDM   1. URI (upper respiratory infection)   For nasal and head congestion may take Sudafed PE 10 mg every 4 hours as needed when not taking the medicine prescribed. Saline nasal spray used frequently and blow your nose frequently. For drainage may use Allegra or Zyrtec during the day if not taking the Rx. Ibuprofen 600 mg every 6 hours as needed for pain, discomfort or fever. Drink plenty of fluids and stay well-hydrated. . Meds ordered this encounter  Medications  . Phenylephrine-Chlorphen-DM 10-02-11.5 MG/5ML LIQD    Sig: Take 5 mLs by mouth every 4 (four) hours as needed. May cause drowsiness.    Dispense:  120 mL    Refill:  0    Order Specific Question:   Supervising Provider    Answer:   Billy Fischer [[id number]]        Janne Napoleon, NP 09/12/16 1720    Janne Napoleon, NP 09/12/16 1721

## 2016-09-12 NOTE — ED Triage Notes (Signed)
Pt is here for cold sx onset 3 weeks associated w/congestion, ST, chills  Denies fevers  Voices no new concerns... A&O x4... NAD

## 2016-09-15 ENCOUNTER — Encounter (HOSPITAL_COMMUNITY)
Admission: RE | Admit: 2016-09-15 | Discharge: 2016-09-15 | Disposition: A | Discharge status: Home / Self Care | Payer: 59 | Source: Ambulatory Visit | Attending: Surgery | Admitting: Surgery

## 2016-09-15 ENCOUNTER — Encounter (HOSPITAL_COMMUNITY): Payer: Self-pay

## 2016-09-15 DIAGNOSIS — Z01818 Encounter for other preprocedural examination: Secondary | ICD-10-CM | POA: Diagnosis not present

## 2016-09-15 LAB — CBC
HEMATOCRIT: 42.6 % (ref 36.0–46.0)
HEMOGLOBIN: 13.4 g/dL (ref 12.0–15.0)
MCH: 29.5 pg (ref 26.0–34.0)
MCHC: 31.5 g/dL (ref 30.0–36.0)
MCV: 93.8 fL (ref 78.0–100.0)
Platelets: 277 10*3/uL (ref 150–400)
RBC: 4.54 MIL/uL (ref 3.87–5.11)
RDW: 12.4 % (ref 11.5–15.5)
WBC: 6.3 10*3/uL (ref 4.0–10.5)

## 2016-09-15 LAB — HCG, SERUM, QUALITATIVE: Preg, Serum: NEGATIVE

## 2016-09-15 MED ORDER — CHLORHEXIDINE GLUCONATE CLOTH 2 % EX PADS: 6.0000 | MEDICATED_PAD | Freq: Once | CUTANEOUS | Status: DC

## 2016-09-15 NOTE — Progress Notes (Signed)
Patient denied having a PCP, having a stress test, cardiac cath, or sleep apnea  Patient informed Nurse that she went to urgent care on 09/12/16 and was diagnosed as having a URI. Patient stated she "feels much better now." Patient denied having any current shortness of breath or having any cardiac issues. Nurse informed patient that if the symptoms reappeared to go back to urgent care. Patient verbalized understanding.

## 2016-09-22 NOTE — Anesthesia Preprocedure Evaluation (Addendum)
Anesthesia Evaluation  Patient identified by MRN, date of birth, ID band Patient awake    Reviewed: Allergy & Precautions, NPO status , Patient's Chart, lab work & pertinent test results  Airway Mallampati: I  TM Distance: >3 FB Neck ROM: Full    Dental  (+) Dental Advisory Given, Teeth Intact   Pulmonary neg pulmonary ROS,    breath sounds clear to auscultation       Cardiovascular negative cardio ROS   Rhythm:Regular Rate:Normal     Neuro/Psych  Headaches,    GI/Hepatic negative GI ROS, Neg liver ROS,   Endo/Other  negative endocrine ROS  Renal/GU negative Renal ROS     Musculoskeletal   Abdominal   Peds  Hematology negative hematology ROS (+)   Anesthesia Other Findings   Reproductive/Obstetrics                           Lab Results  Component Value Date   WBC 6.3 09/15/2016   HGB 13.4 09/15/2016   HCT 42.6 09/15/2016   MCV 93.8 09/15/2016   PLT 277 09/15/2016   Lab Results  Component Value Date   CREATININE 0.73 06/27/2013   BUN 14 06/27/2013   NA 140 06/27/2013   K 3.6 06/27/2013   CL 103 06/27/2013   CO2 29 06/27/2013    Anesthesia Physical Anesthesia Plan  ASA: II  Anesthesia Plan: General   Post-op Pain Management:    Induction: Intravenous  Airway Management Planned: LMA  Additional Equipment:   Intra-op Plan:   Post-operative Plan: Extubation in OR  Informed Consent: I have reviewed the patients History and Physical, chart, labs and discussed the procedure including the risks, benefits and alternatives for the proposed anesthesia with the patient or authorized representative who has indicated his/her understanding and acceptance.   Dental advisory given  Plan Discussed with: CRNA  Anesthesia Plan Comments:        Anesthesia Quick Evaluation

## 2016-09-23 ENCOUNTER — Ambulatory Visit (HOSPITAL_COMMUNITY): Payer: 59 | Admitting: Anesthesiology

## 2016-09-23 ENCOUNTER — Encounter (HOSPITAL_COMMUNITY)
Admission: RE | Disposition: A | Discharge status: Home / Self Care | Payer: Self-pay | Source: Ambulatory Visit | Attending: Surgery

## 2016-09-23 ENCOUNTER — Ambulatory Visit (HOSPITAL_COMMUNITY)
Admission: RE | Admit: 2016-09-23 | Discharge: 2016-09-23 | Disposition: A | Discharge status: Home / Self Care | Payer: 59 | Source: Ambulatory Visit | Attending: Surgery | Admitting: Surgery

## 2016-09-23 ENCOUNTER — Encounter (HOSPITAL_COMMUNITY): Payer: Self-pay | Admitting: Surgery

## 2016-09-23 DIAGNOSIS — D241 Benign neoplasm of right breast: Secondary | ICD-10-CM | POA: Insufficient documentation

## 2016-09-23 HISTORY — PX: BREAST LUMPECTOMY: SHX2

## 2016-09-23 SURGERY — BREAST LUMPECTOMY
Anesthesia: General | Site: Breast | Laterality: Right

## 2016-09-23 MED ORDER — HYDROMORPHONE HCL 1 MG/ML IJ SOLN
0.2500 mg | INTRAMUSCULAR | Status: DC | PRN
  Administered 2016-09-23: 0.25 mg via INTRAVENOUS

## 2016-09-23 MED ORDER — LIDOCAINE 2% (20 MG/ML) 5 ML SYRINGE
INTRAMUSCULAR | Status: DC | PRN
  Administered 2016-09-23: 60 mg via INTRAVENOUS

## 2016-09-23 MED ORDER — PROPOFOL 10 MG/ML IV BOLUS
INTRAVENOUS | Status: AC
  Filled 2016-09-23: qty 40

## 2016-09-23 MED ORDER — BUPIVACAINE-EPINEPHRINE (PF) 0.25% -1:200000 IJ SOLN
INTRAMUSCULAR | Status: AC
  Filled 2016-09-23: qty 30

## 2016-09-23 MED ORDER — CELECOXIB 200 MG PO CAPS
400.0000 mg | ORAL_CAPSULE | ORAL | Status: AC
  Administered 2016-09-23: 400 mg via ORAL

## 2016-09-23 MED ORDER — CEFAZOLIN SODIUM-DEXTROSE 2-4 GM/100ML-% IV SOLN
2.0000 g | INTRAVENOUS | Status: AC
  Administered 2016-09-23: 2 g via INTRAVENOUS

## 2016-09-23 MED ORDER — LIDOCAINE 2% (20 MG/ML) 5 ML SYRINGE
INTRAMUSCULAR | Status: AC
  Filled 2016-09-23: qty 5

## 2016-09-23 MED ORDER — CEFAZOLIN SODIUM-DEXTROSE 2-4 GM/100ML-% IV SOLN
INTRAVENOUS | Status: AC
  Filled 2016-09-23: qty 100

## 2016-09-23 MED ORDER — KETOROLAC TROMETHAMINE 30 MG/ML IJ SOLN
INTRAMUSCULAR | Status: AC
  Filled 2016-09-23: qty 1

## 2016-09-23 MED ORDER — FENTANYL CITRATE (PF) 100 MCG/2ML IJ SOLN
INTRAMUSCULAR | Status: DC | PRN
  Administered 2016-09-23 (×2): 25 ug via INTRAVENOUS

## 2016-09-23 MED ORDER — BUPIVACAINE-EPINEPHRINE 0.25% -1:200000 IJ SOLN
INTRAMUSCULAR | Status: DC | PRN
  Administered 2016-09-23: 7 mL

## 2016-09-23 MED ORDER — ONDANSETRON HCL 4 MG/2ML IJ SOLN
INTRAMUSCULAR | Status: AC
  Filled 2016-09-23: qty 2

## 2016-09-23 MED ORDER — LACTATED RINGERS IV SOLN
INTRAVENOUS | Status: DC | PRN
Start: 2016-09-23 — End: 2016-09-23
  Administered 2016-09-23: 07:00:00 via INTRAVENOUS

## 2016-09-23 MED ORDER — FENTANYL CITRATE (PF) 100 MCG/2ML IJ SOLN
INTRAMUSCULAR | Status: AC
  Filled 2016-09-23: qty 2

## 2016-09-23 MED ORDER — ACETAMINOPHEN 500 MG PO TABS
ORAL_TABLET | ORAL | Status: AC
  Administered 2016-09-23: 1000 mg via ORAL
  Filled 2016-09-23: qty 2

## 2016-09-23 MED ORDER — ACETAMINOPHEN 500 MG PO TABS
1000.0000 mg | ORAL_TABLET | ORAL | Status: AC
  Administered 2016-09-23: 1000 mg via ORAL

## 2016-09-23 MED ORDER — PROMETHAZINE HCL 25 MG/ML IJ SOLN: 6.2500 mg | INTRAMUSCULAR | Status: DC | PRN

## 2016-09-23 MED ORDER — GABAPENTIN 300 MG PO CAPS
300.0000 mg | ORAL_CAPSULE | ORAL | Status: AC
  Administered 2016-09-23: 300 mg via ORAL

## 2016-09-23 MED ORDER — HYDROMORPHONE HCL 1 MG/ML IJ SOLN
INTRAMUSCULAR | Status: AC
  Filled 2016-09-23: qty 1

## 2016-09-23 MED ORDER — KETOROLAC TROMETHAMINE 30 MG/ML IJ SOLN
30.0000 mg | Freq: Once | INTRAMUSCULAR | Status: AC | PRN
  Administered 2016-09-23: 30 mg via INTRAVENOUS

## 2016-09-23 MED ORDER — CELECOXIB 200 MG PO CAPS
ORAL_CAPSULE | ORAL | Status: AC
  Administered 2016-09-23: 400 mg via ORAL
  Filled 2016-09-23: qty 2

## 2016-09-23 MED ORDER — GABAPENTIN 300 MG PO CAPS
ORAL_CAPSULE | ORAL | Status: AC
  Administered 2016-09-23: 300 mg via ORAL
  Filled 2016-09-23: qty 1

## 2016-09-23 MED ORDER — ONDANSETRON HCL 4 MG/2ML IJ SOLN
INTRAMUSCULAR | Status: DC | PRN
  Administered 2016-09-23: 4 mg via INTRAVENOUS

## 2016-09-23 MED ORDER — HYDROCODONE-ACETAMINOPHEN 5-325 MG PO TABS
1.0000 | ORAL_TABLET | Freq: Four times a day (QID) | ORAL | 0 refills | Status: DC | PRN
Start: 2016-09-23 — End: 2016-11-30

## 2016-09-23 MED ORDER — MIDAZOLAM HCL 2 MG/2ML IJ SOLN
INTRAMUSCULAR | Status: DC | PRN
  Administered 2016-09-23: 2 mg via INTRAVENOUS

## 2016-09-23 MED ORDER — 0.9 % SODIUM CHLORIDE (POUR BTL) OPTIME
TOPICAL | Status: DC | PRN
  Administered 2016-09-23: 1000 mL

## 2016-09-23 MED ORDER — MIDAZOLAM HCL 2 MG/2ML IJ SOLN
INTRAMUSCULAR | Status: AC
  Filled 2016-09-23: qty 2

## 2016-09-23 MED ORDER — PROPOFOL 10 MG/ML IV BOLUS
INTRAVENOUS | Status: DC | PRN
  Administered 2016-09-23: 200 mg via INTRAVENOUS
  Administered 2016-09-23: 50 mg via INTRAVENOUS

## 2016-09-23 SURGICAL SUPPLY — 47 items
APPLIER CLIP 9.375 MED OPEN (MISCELLANEOUS)
BENZOIN TINCTURE PRP APPL 2/3 (GAUZE/BANDAGES/DRESSINGS) ×3 IMPLANT
BINDER BREAST LRG (GAUZE/BANDAGES/DRESSINGS) IMPLANT
BINDER BREAST XLRG (GAUZE/BANDAGES/DRESSINGS) IMPLANT
BLADE SURG ROTATE 9660 (MISCELLANEOUS) IMPLANT
CHLORAPREP W/TINT 26ML (MISCELLANEOUS) ×3 IMPLANT
CLIP APPLIE 9.375 MED OPEN (MISCELLANEOUS) IMPLANT
CLIP TI MEDIUM 6 (CLIP) ×3 IMPLANT
CLIP TI WIDE RED SMALL 24 (CLIP) IMPLANT
CLOSURE STERI-STRIP 1/2X4 (GAUZE/BANDAGES/DRESSINGS) ×1
CLOSURE WOUND 1/2 X4 (GAUZE/BANDAGES/DRESSINGS) ×1
CLSR STERI-STRIP ANTIMIC 1/2X4 (GAUZE/BANDAGES/DRESSINGS) ×2 IMPLANT
CONT SPEC 4OZ CLIKSEAL STRL BL (MISCELLANEOUS) ×3 IMPLANT
COVER SURGICAL LIGHT HANDLE (MISCELLANEOUS) ×3 IMPLANT
DECANTER SPIKE VIAL GLASS SM (MISCELLANEOUS) IMPLANT
DRAPE LAPAROTOMY TRNSV 102X78 (DRAPE) ×3 IMPLANT
DRAPE UTILITY XL STRL (DRAPES) ×6 IMPLANT
DRSG TEGADERM 4X4.75 (GAUZE/BANDAGES/DRESSINGS) ×3 IMPLANT
ELECT CAUTERY BLADE 6.4 (BLADE) ×3 IMPLANT
ELECT REM PT RETURN 9FT ADLT (ELECTROSURGICAL) ×3
ELECTRODE REM PT RTRN 9FT ADLT (ELECTROSURGICAL) ×1 IMPLANT
GAUZE SPONGE 2X2 8PLY STRL LF (GAUZE/BANDAGES/DRESSINGS) ×1 IMPLANT
GAUZE SPONGE 4X4 12PLY STRL (GAUZE/BANDAGES/DRESSINGS) ×3 IMPLANT
GLOVE BIO SURGEON STRL SZ7 (GLOVE) ×3 IMPLANT
GLOVE BIOGEL PI IND STRL 7.5 (GLOVE) ×1 IMPLANT
GLOVE BIOGEL PI INDICATOR 7.5 (GLOVE) ×2
GOWN STRL REUS W/ TWL LRG LVL3 (GOWN DISPOSABLE) ×2 IMPLANT
GOWN STRL REUS W/TWL LRG LVL3 (GOWN DISPOSABLE) ×4
KIT BASIN OR (CUSTOM PROCEDURE TRAY) ×3 IMPLANT
KIT MARKER MARGIN INK (KITS) IMPLANT
KIT ROOM TURNOVER OR (KITS) ×3 IMPLANT
LIGHT WAVEGUIDE WIDE FLAT (MISCELLANEOUS) ×3 IMPLANT
NEEDLE HYPO 25GX1X1/2 BEV (NEEDLE) ×3 IMPLANT
NS IRRIG 1000ML POUR BTL (IV SOLUTION) ×3 IMPLANT
PACK GENERAL/GYN (CUSTOM PROCEDURE TRAY) ×3 IMPLANT
PAD ARMBOARD 7.5X6 YLW CONV (MISCELLANEOUS) ×6 IMPLANT
SPECIMEN JAR MEDIUM (MISCELLANEOUS) ×3 IMPLANT
SPONGE GAUZE 2X2 STER 10/PKG (GAUZE/BANDAGES/DRESSINGS) ×2
SPONGE LAP 4X18 X RAY DECT (DISPOSABLE) ×6 IMPLANT
STAPLER VISISTAT 35W (STAPLE) ×3 IMPLANT
STRIP CLOSURE SKIN 1/2X4 (GAUZE/BANDAGES/DRESSINGS) ×2 IMPLANT
SUT MNCRL AB 4-0 PS2 18 (SUTURE) ×3 IMPLANT
SUT VIC AB 3-0 SH 27 (SUTURE) ×2
SUT VIC AB 3-0 SH 27X BRD (SUTURE) ×1 IMPLANT
SYR CONTROL 10ML LL (SYRINGE) ×3 IMPLANT
TOWEL OR 17X24 6PK STRL BLUE (TOWEL DISPOSABLE) ×3 IMPLANT
TOWEL OR 17X26 10 PK STRL BLUE (TOWEL DISPOSABLE) ×3 IMPLANT

## 2016-09-23 NOTE — Anesthesia Postprocedure Evaluation (Signed)
Anesthesia Post Note  Patient: Alejandra Martinez  Procedure(s) Performed: Procedure(s) (LRB): RIGHT BREAST LUMPECTOMY (Right)  Patient location during evaluation: PACU Anesthesia Type: General Level of consciousness: awake and alert Pain management: pain level controlled Vital Signs Assessment: post-procedure vital signs reviewed and stable Respiratory status: spontaneous breathing, nonlabored ventilation, respiratory function stable and patient connected to nasal cannula oxygen Cardiovascular status: blood pressure returned to baseline and stable Postop Assessment: no signs of nausea or vomiting Anesthetic complications: no    Last Vitals:  Vitals:   09/23/16 0910 09/23/16 0911  BP: 112/72 111/75  Pulse: 72   Resp: 16 18  Temp: 36.3 C 36.4 C    Last Pain:  Vitals:   09/23/16 0911  TempSrc:   PainSc: 1                  Tiajuana Amass

## 2016-09-23 NOTE — Op Note (Signed)
Pre-op diagnosis:  Fibroadenoma right breast Post-op diagnosis:  Same Procedure:  Right breast lumpectomy Surgeon:  Jermey Closs K. Anesthesia: Gen - LMA Indications:  This is a 20 year old female who presents with a three-year history of a palpable mass in her right breast. This has been followed on ultrasound. The patient is concerned because of the persistence of this mass. She would like to have it removed. This is felt to represent a fibroadenoma.  Description of procedure: The patient is brought to the operating room and placed in a supine position on the operating room table. After an adequate level of general anesthesia was obtained, her right breast was prepped with ChloraPrep and draped sterile fashion. A timeout was taken to ensure the proper patient and proper procedure. The mass is palpated at 6:00 just below the edge of the areole. We infiltrated the area around the mass with 0.25% Marcaine with epinephrine. A curvilinear circumareolar incision was made. We dissected down into the breast tissue with cautery. We dissected completely around the mass and removed entirely. This was oriented with a paint kit. We inspected the wound for hemostasis. The wound was closed with 3-0 Vicryl and 4-0 Monocryl. Steri-Strips and an occlusive dressing were applied. The patient was then extubated and brought to the recovery room in stable condition. All sponge, instrument, and needle counts are correct.  Alejandra Martinez. Georgette Dover, MD, [facility]  General/ Trauma Surgery  09/23/2016 8:16 AM

## 2016-09-23 NOTE — Transfer of Care (Signed)
Immediate Anesthesia Transfer of Care Note  Patient: Alejandra Martinez  Procedure(s) Performed: Procedure(s): RIGHT BREAST LUMPECTOMY (Right)  Patient Location: PACU  Anesthesia Type:General  Level of Consciousness: awake, alert  and oriented  Airway & Oxygen Therapy: Patient Spontanous Breathing  Post-op Assessment: Report given to RN, Post -op Vital signs reviewed and stable and Patient moving all extremities X 4  Post vital signs: Reviewed and stable  Last Vitals:  Vitals:   09/23/16 0620 09/23/16 0821  BP: 108/71   Pulse: (!) 0   Resp: 18   Temp: 36.8 C 36.6 C    Last Pain:  Vitals:   09/23/16 0636  TempSrc:   PainSc: 3       Patients Stated Pain Goal: 4 ([id number])  Complications: No apparent anesthesia complications

## 2016-09-23 NOTE — Discharge Instructions (Signed)
[facility] °Office Phone Number [phone] ° °BREAST BIOPSY/ PARTIAL MASTECTOMY: POST OP INSTRUCTIONS ° °Always review your discharge instruction sheet given to you by the facility where your surgery was performed. ° °IF YOU HAVE DISABILITY OR FAMILY LEAVE FORMS, YOU MUST BRING THEM TO THE OFFICE FOR PROCESSING.  DO NOT GIVE THEM TO YOUR DOCTOR. ° °1. A prescription for pain medication may be given to you upon discharge.  Take your pain medication as prescribed, if needed.  If narcotic pain medicine is not needed, then you may take acetaminophen (Tylenol) or ibuprofen (Advil) as needed. °2. Take your usually prescribed medications unless otherwise directed °3. If you need a refill on your pain medication, please contact your pharmacy.  They will contact our office to request authorization.  Prescriptions will not be filled after 5pm or on week-ends. °4. You should eat very light the first 24 hours after surgery, such as soup, crackers, pudding, etc.  Resume your normal diet the day after surgery. °5. Most patients will experience some swelling and bruising in the breast.  Ice packs and a good support bra will help.  Swelling and bruising can take several days to resolve.  °6. It is common to experience some constipation if taking pain medication after surgery.  Increasing fluid intake and taking a stool softener will usually help or prevent this problem from occurring.  A mild laxative (Milk of Magnesia or Miralax) should be taken according to package directions if there are no bowel movements after 48 hours. °7. Unless discharge instructions indicate otherwise, you may remove your bandages 24-48 hours after surgery, and you may shower at that time.  You may have steri-strips (small skin tapes) in place directly over the incision.  These strips should be left on the skin for 7-10 days.  If your surgeon used skin glue on the incision, you may shower in 24 hours.  The glue will flake off over the  next 2-3 weeks.  Any sutures or staples will be removed at the office during your follow-up visit. °8. ACTIVITIES:  You may resume regular daily activities (gradually increasing) beginning the next day.  Wearing a good support bra or sports bra minimizes pain and swelling.  You may have sexual intercourse when it is comfortable. °a. You may drive when you no longer are taking prescription pain medication, you can comfortably wear a seatbelt, and you can safely maneuver your car and apply brakes. °b. RETURN TO WORK:  ______________________________________________________________________________________ °9. You should see your doctor in the office for a follow-up appointment approximately two weeks after your surgery.  Your doctor’s nurse will typically make your follow-up appointment when she calls you with your pathology report.  Expect your pathology report 2-3 business days after your surgery.  You may call to check if you do not hear from us after three days. °10. OTHER INSTRUCTIONS: _______________________________________________________________________________________________ _____________________________________________________________________________________________________________________________________ °_____________________________________________________________________________________________________________________________________ °_____________________________________________________________________________________________________________________________________ ° °WHEN TO CALL YOUR DOCTOR: °1. Fever over 101.0 °2. Nausea and/or vomiting. °3. Extreme swelling or bruising. °4. Continued bleeding from incision. °5. Increased pain, redness, or drainage from the incision. ° °The clinic staff is available to answer your questions during regular business hours.  Please don’t hesitate to call and ask to speak to one of the nurses for clinical concerns.  If you have a medical emergency, go to the nearest  emergency room or call 911.  A surgeon from [facility] is always on call at the hospital. ° °For further questions, please visit [email or web address]  °

## 2016-09-23 NOTE — H&P (Signed)
History of Present Illness  Patient words: New-cyst R breast.  The patient is a 20 year old female who presents with a breast mass. Referred by Franki Cabot, MD for right breast fibroadenoma  This is a 20 year old female who presents with a palpable mass in her right breast that has been followed for the last 3 years. She initially noticed this on self examination in 2014. She has had a total of 4 ultrasounds to evaluate this area. The mass seems to be either stable or slightly smaller. However the patient is concerned about the persistence of this mass and would like to have it removed. This area causes some mild tenderness. She does not have a family history of breast cancer.  Menarche age 67 Nulliparous She has been on Depo-Provera but recently stopped  CLINICAL DATA: Follow-up for probably benign fibroadenoma in the right breast.  EXAM: ULTRASOUND OF THE RIGHT BREAST  COMPARISON: Diagnostic ultrasounds dated 03/19/2015, 07/18/2014 and 12/14/2013.  FINDINGS: Targeted ultrasound is performed, again showing the oval circumscribed hypoechoic mass in the right breast at the 7 o'clock axis, 1-2 cm from the nipple (previously described as retroareolar), measuring 1.8 x 0.8 x 1.6 cm, decreased in size compared to the previous exams.  IMPRESSION: Benign right breast mass at the 7 o'clock axis, now measuring 1.8 x 0.8 x 1.6 cm, decreased in size compared to previous exams dating back more than 2 years indicating benignity, presumed fibroadenoma.  RECOMMENDATION: Screening mammogram at age 69 unless there are persistent or intervening clinical concerns. (Code:SM-B-40A)  Patient requests surgical consultation for possible excision of the benign right breast mass. Patient will be contacted by our office with appointment information.  I have discussed the findings and recommendations with the patient. Results were also provided in writing at the conclusion of  the visit. If applicable, a reminder letter will be sent to the patient regarding the next appointment.  BI-RADS CATEGORY 2: Benign Finding(s)  Electronically Signed: By: Franki Cabot M.D. On: 07/03/2016 11:24    Other Problems  Anxiety Disorder Migraine Headache  Past Surgical History  No pertinent past surgical history  Diagnostic Studies History  Colonoscopy never Mammogram never Pap Smear 1-5 years ago  Allergies  No Known Drug Allergies 08/04/2016  Medication History No Current Medications Medications Reconciled  Social History  Alcohol use Occasional alcohol use. No drug use Tobacco use Never smoker.  Family History  First Degree Relatives No pertinent family history  Pregnancy / Birth History Age at menarche 20 years. Contraceptive History Depo-provera, Oral contraceptives. Irregular periods     Review of Systems  General Not Present- Appetite Loss, Chills, Fatigue, Fever, Night Sweats, Weight Gain and Weight Loss. Skin Not Present- Change in Wart/Mole, Dryness, Hives, Jaundice, New Lesions, Non-Healing Wounds, Rash and Ulcer. HEENT Present- Seasonal Allergies and Wears glasses/contact lenses. Not Present- Earache, Hearing Loss, Hoarseness, Nose Bleed, Oral Ulcers, Ringing in the Ears, Sinus Pain, Sore Throat, Visual Disturbances and Yellow Eyes. Breast Present- Breast Pain. Not Present- Breast Mass, Nipple Discharge and Skin Changes. Cardiovascular Not Present- Chest Pain, Difficulty Breathing Lying Down, Leg Cramps, Palpitations, Rapid Heart Rate, Shortness of Breath and Swelling of Extremities. Gastrointestinal Not Present- Abdominal Pain, Bloating, Bloody Stool, Change in Bowel Habits, Chronic diarrhea, Constipation, Difficulty Swallowing, Excessive gas, Gets full quickly at meals, Hemorrhoids, Indigestion, Nausea, Rectal Pain and Vomiting. Female Genitourinary Not Present- Frequency, Nocturia, Painful Urination,  Pelvic Pain and Urgency. Neurological Not Present- Decreased Memory, Fainting, Headaches, Numbness, Seizures, Tingling, Tremor, Trouble walking and Weakness.  Psychiatric Not Present- Anxiety, Bipolar, Change in Sleep Pattern, Depression, Fearful and Frequent crying. Endocrine Not Present- Cold Intolerance, Excessive Hunger, Hair Changes, Heat Intolerance, Hot flashes and New Diabetes. Hematology Not Present- Blood Thinners, Easy Bruising, Excessive bleeding, Gland problems, HIV and Persistent Infections.  Vitals  Weight: 168.8 lb Height: 63.5in Body Surface Area: 1.81 m Body Mass Index: 29.43 kg/m  Temp.: 98.71F(Oral)  Pulse: 75 (Regular)  BP: 110/66 (Sitting, Left Arm, Standard)      Physical Exam   The physical exam findings are as follows: Note:WDWN in NAD HEENT: EOMI, sclera anicteric Neck: No masses, no thyromegaly Lungs: CTA bilaterally; normal respiratory effort Breasts: bilateral nipple piercings; no axillary lymphadenopathy on either side; bilateral fibrocystic changes; Right breast - 6:00 at edge of areola - 2 cm firm palpable mass; mobile Left breast - no dominant masses CV: Regular rate and rhythm; no murmurs Abd: +bowel sounds, soft, non-tender, no masses Ext: Well-perfused; no edema Skin: Warm, dry; no sign of jaundice    Assessment & Plan   BREAST FIBROADENOMA IN FEMALE, RIGHT (D24.1)  Current Plans Schedule for Surgery - Right breast lumpectomy. The surgical procedure has been discussed with the patient. Potential risks, benefits, alternative treatments, and expected outcomes have been explained. All of the patient's questions at this time have been answered. The likelihood of reaching the patient's treatment goal is good. The patient understand the proposed surgical procedure and wishes to proceed.  Imogene Burn. Georgette Dover, MD, [facility]  General/ Trauma Surgery  09/23/2016 7:11 AM

## 2016-09-23 NOTE — Anesthesia Procedure Notes (Signed)
Procedure Name: LMA Insertion Date/Time: 09/23/2016 7:37 AM Performed by: Garrison Columbus T Pre-anesthesia Checklist: Patient identified, Emergency Drugs available, Suction available and Patient being monitored Patient Re-evaluated:Patient Re-evaluated prior to inductionOxygen Delivery Method: Circle System Utilized Preoxygenation: Pre-oxygenation with 100% oxygen Intubation Type: IV induction LMA: LMA inserted LMA Size: 4.0 Number of attempts: 1 Airway Equipment and Method: Bite block Placement Confirmation: positive ETCO2 and breath sounds checked- equal and bilateral Tube secured with: Tape Dental Injury: Teeth and Oropharynx as per pre-operative assessment

## 2016-09-23 NOTE — Anesthesia Postprocedure Evaluation (Signed)
Anesthesia Post Note  Patient: Fish farm manager  Procedure(s) Performed: Procedure(s) (LRB): RIGHT BREAST LUMPECTOMY (Right)  Patient location during evaluation: PACU Anesthesia Type: General Level of consciousness: awake and alert Pain management: pain level controlled Vital Signs Assessment: post-procedure vital signs reviewed and stable Respiratory status: spontaneous breathing, nonlabored ventilation, respiratory function stable and patient connected to nasal cannula oxygen Cardiovascular status: blood pressure returned to baseline and stable Postop Assessment: no signs of nausea or vomiting Anesthetic complications: no    Last Vitals:  Vitals:   09/23/16 0910 09/23/16 0911  BP: 112/72 111/75  Pulse: 72   Resp: 16 18  Temp: 36.3 C 36.4 C    Last Pain:  Vitals:   09/23/16 0911  TempSrc:   PainSc: 1                  Tiajuana Amass

## 2016-09-24 ENCOUNTER — Encounter (HOSPITAL_COMMUNITY): Payer: Self-pay | Admitting: Surgery

## 2016-11-14 ENCOUNTER — Ambulatory Visit: Payer: 59 | Admitting: Obstetrics and Gynecology

## 2016-11-30 ENCOUNTER — Encounter (HOSPITAL_COMMUNITY): Payer: Self-pay | Admitting: *Deleted

## 2016-11-30 ENCOUNTER — Ambulatory Visit (HOSPITAL_COMMUNITY)
Admission: EM | Admit: 2016-11-30 | Discharge: 2016-11-30 | Disposition: A | Discharge status: Home / Self Care | Payer: Worker's Compensation | Attending: Emergency Medicine | Admitting: Emergency Medicine

## 2016-11-30 ENCOUNTER — Ambulatory Visit (INDEPENDENT_AMBULATORY_CARE_PROVIDER_SITE_OTHER): Payer: Worker's Compensation

## 2016-11-30 DIAGNOSIS — S60112A Contusion of left thumb with damage to nail, initial encounter: Secondary | ICD-10-CM

## 2016-11-30 DIAGNOSIS — Z23 Encounter for immunization: Secondary | ICD-10-CM

## 2016-11-30 LAB — POCT PREGNANCY, URINE: PREG TEST UR: NEGATIVE

## 2016-11-30 MED ORDER — TETANUS-DIPHTH-ACELL PERTUSSIS 5-2.5-18.5 LF-MCG/0.5 IM SUSP
INTRAMUSCULAR | Status: AC
  Filled 2016-11-30: qty 0.5

## 2016-11-30 MED ORDER — POVIDONE-IODINE 10 % EX SOLN
CUTANEOUS | Status: AC
Start: 2016-11-30 — End: 2016-11-30
  Filled 2016-11-30: qty 118

## 2016-11-30 MED ORDER — TETANUS-DIPHTH-ACELL PERTUSSIS 5-2.5-18.5 LF-MCG/0.5 IM SUSP
0.5000 mL | Freq: Once | INTRAMUSCULAR | Status: AC
  Administered 2016-11-30: 0.5 mL via INTRAMUSCULAR

## 2016-11-30 NOTE — ED Notes (Signed)
Dressing applied per A. Amyot.

## 2016-11-30 NOTE — ED Triage Notes (Signed)
Reports cutting left thumb on a dough hook @ approx 0930 this AM.  Has applied ice and wrapped thumb.  Denies any numbness/tingling.

## 2016-11-30 NOTE — ED Provider Notes (Signed)
CSN: [id number]     Arrival date & time 11/30/16  1526 History   First MD Initiated Contact with Patient 11/30/16 1658     Chief Complaint  Patient presents with  . Hand Injury   (Consider location/radiation/quality/duration/timing/severity/associated sxs/prior Treatment) 20 year old female presents with injury to her left thumb this morning. She hit her thumb on a dough hook and it started bleeding. She wrapped up her thumb and did not put ice on area due to bleeding. Her thumb is feeling better now and has more movement but still concerned about possible fracture to thumb and whether artificial and natural finger nail will need to be removed. Denies any numbness or active bleeding now. Uncertain when she had her last Tetanus.    The history is provided by the patient.  Hand Injury  Associated symptoms: no fever     Past Medical History:  Diagnosis Date  . Anemia   . Anxiety   . Dysmenorrhea   . History of broken nose    hairline fracture  . Migraines    Past Surgical History:  Procedure Laterality Date  . BREAST LUMPECTOMY Right 09/23/2016   Procedure: RIGHT BREAST LUMPECTOMY;  Surgeon: Donnie Mesa, MD;  Location: [place];  Service: General;  Laterality: Right;  . WISDOM TOOTH EXTRACTION  07/2013   Family History  Problem Relation Age of Onset  . Migraines Mother   . Stroke Paternal Grandmother    Social History  Substance Use Topics  . Smoking status: Never Smoker  . Smokeless tobacco: Never Used  . Alcohol use Yes     Comment: socially   OB History    Gravida Para Term Preterm AB Living   0             SAB TAB Ectopic Multiple Live Births                 Review of Systems  Constitutional: Negative for fever.  Gastrointestinal: Negative for nausea and vomiting.  Musculoskeletal: Positive for joint swelling and myalgias.  Skin: Positive for wound.  Allergic/Immunologic: Negative for immunocompromised state.  Neurological: Negative for tremors, weakness,  numbness and headaches.  Hematological: Does not bruise/bleed easily.    Allergies  Other  Home Medications   Prior to Admission medications   Not on File   Meds Ordered and Administered this Visit   Medications  Tdap (BOOSTRIX) injection 0.5 mL (0.5 mLs Intramuscular Given 11/30/16 1724)    BP 105/65   Pulse 68   Temp 97.5 F (36.4 C) (Oral)   Resp 16   SpO2 100%  No data found.   Physical Exam  Constitutional: She is oriented to person, place, and time. She appears well-developed and well-nourished. No distress.  Cardiovascular: Normal rate and regular rhythm.   Pulmonary/Chest: Effort normal and breath sounds normal.  Musculoskeletal: She exhibits edema and tenderness.       Left hand: She exhibits decreased range of motion, tenderness and swelling. She exhibits normal capillary refill. Normal sensation noted. Normal strength noted.       Hands: Slight bleeding present at base of nail on left thumb. No distinct laceration. Trauma to base of nail. Decreased range of motion of distal joint. Tender at DIP and MIP. Slight swelling present. Good capillary refill and pulses. No neuro deficits noted.   Neurological: She is alert and oriented to person, place, and time. She has normal strength. No sensory deficit.  Skin: Skin is warm and dry. Capillary refill  takes less than 2 seconds.  Psychiatric: She has a normal mood and affect. Her behavior is normal. Judgment and thought content normal.    Urgent Care Course   Clinical Course     Procedures (including critical care time)  Labs Review Labs Reviewed  POCT PREGNANCY, URINE    Imaging Review Dg Finger Thumb Left  Result Date: 11/30/2016 CLINICAL DATA:  Left thumb pain following an injury at work this morning. EXAM: LEFT THUMB 2+V COMPARISON:  None. FINDINGS: There is no evidence of fracture or dislocation. There is no evidence of arthropathy or other focal bone abnormality. Soft tissues are unremarkable IMPRESSION:  Normal examination. Electronically Signed   By: Claudie Revering M.D.   On: 11/30/2016 17:26     Visual Acuity Review  Right Eye Distance:   Left Eye Distance:   Bilateral Distance:    Right Eye Near:   Left Eye Near:    Bilateral Near:         MDM   1. Contusion of left thumb with damage to nail, initial encounter    Reviewed negative x-ray results with patient. Discussed that she has bruised her nail bed at the base of her nail and may eventually lose her natural and artificial nail but still attached now. Recommend apply Neosporin to the area and clean daily. Cover with a bandage for protection. Take Ibuprofen 600mg  every 8 hours as needed for pain and swelling. Tdap given today. Recommend follow-up if any increased redness, swelling, pain or discharge occurs.     Katy Apo, NP 12/01/16 [id number]

## 2016-11-30 NOTE — Discharge Instructions (Signed)
Recommend apply Neosporin and clean daily. Cover with bandage for protection. Take Ibuprofen 600mg  every 8 hours as needed for pain and swelling. If any increased redness, swelling, pain or any discharge occurs, return for recheck.

## 2016-11-30 NOTE — ED Triage Notes (Signed)
Upon removal of bandage, no lacerations noted, but small amt dried blood noted to borders of nailbed.  Acrylic nail in place, but cracked.

## 2016-12-27 ENCOUNTER — Encounter (HOSPITAL_COMMUNITY): Payer: Self-pay | Admitting: Emergency Medicine

## 2016-12-27 ENCOUNTER — Ambulatory Visit (HOSPITAL_COMMUNITY)
Admission: EM | Admit: 2016-12-27 | Discharge: 2016-12-27 | Disposition: A | Discharge status: Home / Self Care | Payer: Worker's Compensation | Attending: Emergency Medicine | Admitting: Emergency Medicine

## 2016-12-27 DIAGNOSIS — IMO0001 Reserved for inherently not codable concepts without codable children: Secondary | ICD-10-CM

## 2016-12-27 DIAGNOSIS — S61309D Unspecified open wound of unspecified finger with damage to nail, subsequent encounter: Secondary | ICD-10-CM

## 2016-12-27 MED ORDER — DOXYCYCLINE HYCLATE 100 MG PO CAPS: 100.0000 mg | ORAL_CAPSULE | Freq: Two times a day (BID) | ORAL | 0 refills | Status: DC

## 2016-12-27 NOTE — ED Triage Notes (Signed)
Here for a f/u on left thumb inj... Reports pain is getting worse  A&O x4... NAD

## 2016-12-27 NOTE — ED Notes (Signed)
Pt came back needing note for work... Per Eliberto Ivory, NP, she will not give note for work since pt left AMA.  Pt reports we are doing nothing so there is no reason for her to stay  Adv her that Anderson Malta was willing to give her an antibiotic that she refused  Pt reports she will take the antibiotic... Notified her that she will need to be seen... Pt was upset but will wait... Placed pt in room 10

## 2016-12-27 NOTE — ED Notes (Signed)
Pt left AMA while Eliberto Ivory, NP was w/pt in room.

## 2016-12-27 NOTE — ED Provider Notes (Signed)
CSN: [id number]     Arrival date & time 12/27/16  1437 History   None    Chief Complaint  Patient presents with  . Follow-up   (Consider location/radiation/quality/duration/timing/severity/associated sxs/prior Treatment) 20 y.o. female presents with pain to her left thumb after an injury at work. Patient was seen at this facility at time of accident. Patient states that her thumbnail is coming off and she would like for [place] to remove it. Pain is increased when the patient "keeps hitting the nail and making it hurt"  Patient reports discharge underneath the nail bed. Erythema noted to base of  Nail bed. Patient has acrylic nail attached to real nail.   Recommended patient be placed on antibiotics and acrylic nail be cut down to allow growth for new nail. Patient refused and treatment and walked out of the facility .  Patient returned asking for a work note. Note refused since patient left before treatment complete.  Patient asking to return to her room for antibiotics  Patient was brought back to room where nail was removed and dressing applied      Past Medical History:  Diagnosis Date  . Anemia   . Anxiety   . Dysmenorrhea   . History of broken nose    hairline fracture  . Migraines    Past Surgical History:  Procedure Laterality Date  . BREAST LUMPECTOMY Right 09/23/2016   Procedure: RIGHT BREAST LUMPECTOMY;  Surgeon: Donnie Mesa, MD;  Location: [place];  Service: General;  Laterality: Right;  . WISDOM TOOTH EXTRACTION  07/2013   Family History  Problem Relation Age of Onset  . Migraines Mother   . Stroke Paternal Grandmother    Social History  Substance Use Topics  . Smoking status: Never Smoker  . Smokeless tobacco: Never Used  . Alcohol use Yes     Comment: socially   OB History    Gravida Para Term Preterm AB Living   0             SAB TAB Ectopic Multiple Live Births                 Review of Systems  Constitutional: Negative for chills and fever.  HENT:  Negative for ear pain and sore throat.   Eyes: Negative for pain and visual disturbance.  Respiratory: Negative for cough and shortness of breath.   Cardiovascular: Negative for chest pain and palpitations.  Gastrointestinal: Negative for abdominal pain and vomiting.  Genitourinary: Negative for dysuria and hematuria.  Musculoskeletal: Negative for arthralgias and back pain.  Skin: Negative for color change and rash.       Pain to left thumb with discharge  Neurological: Negative for seizures and syncope.  All other systems reviewed and are negative.   Allergies  Other  Home Medications   Prior to Admission medications   Not on File   Meds Ordered and Administered this Visit  Medications - No data to display  BP 117/75 (BP Location: Left Arm)   Pulse 75   Temp 98.4 F (36.9 C) (Oral)   Resp 16   SpO2 100%  No data found.   Physical Exam  Constitutional: She is oriented to person, place, and time. She appears well-developed and well-nourished.  HENT:  Head: Normocephalic and atraumatic.  Eyes: Conjunctivae are normal.  Neck: Normal range of motion.  Pulmonary/Chest: Effort normal.  Musculoskeletal: Normal range of motion.  Neurological: She is alert and oriented to person, place, and time.  Skin:  Skin is warm.  Nail to left thumb partially removed.   Psychiatric: She has a normal mood and affect.  Nursing note and vitals reviewed.   Urgent Care Course   Clinical Course     .Nail Removal Date/Time: 12/27/2016 6:39 PM Performed by: Jacqualine Mau Authorized by: Melony Overly   Consent:    Consent obtained:  Verbal   Consent given by:  Patient   Risks discussed:  Bleeding, incomplete removal, infection, pain and permanent nail deformity   Alternatives discussed:  No treatment, delayed treatment, alternative treatment and observation Location:    Hand:  L thumb Pre-procedure details:    Skin preparation:  Alcohol Anesthesia (see MAR for exact  dosages):    Anesthesia method:  Local infiltration Nail Removal:    Nail removed:  Complete   Nail bed repaired: no     Removed nail replaced and anchored: no     (including critical care time)  Labs Review Labs Reviewed - No data to display  Imaging Review No results found.      MDM  No diagnosis found.     Jacqualine Mau, NP 12/27/16 [id number]

## 2016-12-27 NOTE — ED Notes (Signed)
Dressing applied to right thumb, supplies given for home dressing changes

## 2016-12-29 ENCOUNTER — Encounter (HOSPITAL_COMMUNITY): Payer: Self-pay | Admitting: *Deleted

## 2016-12-29 ENCOUNTER — Emergency Department (HOSPITAL_COMMUNITY)
Admission: EM | Admit: 2016-12-29 | Discharge: 2016-12-29 | Disposition: A | Discharge status: Home / Self Care | Payer: 59 | Attending: Emergency Medicine | Admitting: Emergency Medicine

## 2016-12-29 DIAGNOSIS — L509 Urticaria, unspecified: Secondary | ICD-10-CM | POA: Diagnosis not present

## 2016-12-29 DIAGNOSIS — Z79899 Other long term (current) drug therapy: Secondary | ICD-10-CM | POA: Insufficient documentation

## 2016-12-29 DIAGNOSIS — R21 Rash and other nonspecific skin eruption: Secondary | ICD-10-CM | POA: Diagnosis present

## 2016-12-29 MED ORDER — DIPHENHYDRAMINE HCL 25 MG PO CAPS
50.0000 mg | ORAL_CAPSULE | Freq: Once | ORAL | Status: AC
  Administered 2016-12-29: 50 mg via ORAL
  Filled 2016-12-29: qty 2

## 2016-12-29 MED ORDER — FAMOTIDINE 20 MG PO TABS
40.0000 mg | ORAL_TABLET | Freq: Once | ORAL | Status: AC
  Administered 2016-12-29: 40 mg via ORAL
  Filled 2016-12-29: qty 2

## 2016-12-29 MED ORDER — PREDNISONE 20 MG PO TABS: ORAL_TABLET | ORAL | 0 refills | Status: DC

## 2016-12-29 MED ORDER — PREDNISONE 20 MG PO TABS
60.0000 mg | ORAL_TABLET | Freq: Once | ORAL | Status: AC
  Administered 2016-12-29: 60 mg via ORAL
  Filled 2016-12-29: qty 3

## 2016-12-29 NOTE — ED Notes (Signed)
Pt departed in NAD, refused use of wheelchair.  

## 2016-12-29 NOTE — Discharge Instructions (Signed)
Take benadryl 50 mg 4 x a day for the next two days.  Follow up with your PCP, they may send you to see an allergist.

## 2016-12-29 NOTE — ED Provider Notes (Signed)
[facility] DEPT Provider Note   CSN: [id number] Arrival date & time: 12/29/16  [id number]     History   Chief Complaint Chief Complaint  Patient presents with  . Rash    HPI Alejandra Martinez is a 21 y.o. female.  21 yo F with a chief complaint of hives. She woke up and was really itchy and noticed these to her chest abdomen and back. Denies any new food intake denies any new soaps or lotions. Denies shortness of breath denies diarrhea denies vomiting denies feeling of syncope.   The history is provided by the patient.  Rash   This is a new problem. The current episode started less than 1 hour ago. The problem has not changed since onset.The problem is associated with nothing. There has been no fever. The pain is at a severity of 6/10. The patient is experiencing no pain. The pain has been constant since onset. She has tried nothing for the symptoms. The treatment provided no relief.    Past Medical History:  Diagnosis Date  . Anemia   . Anxiety   . Dysmenorrhea   . History of broken nose    hairline fracture  . Migraines     Patient Active Problem List   Diagnosis Date Noted  . Lump of right breast 10/19/2013    Past Surgical History:  Procedure Laterality Date  . BREAST LUMPECTOMY Right 09/23/2016   Procedure: RIGHT BREAST LUMPECTOMY;  Surgeon: Donnie Mesa, MD;  Location: [place];  Service: General;  Laterality: Right;  . WISDOM TOOTH EXTRACTION  07/2013    OB History    Gravida Para Term Preterm AB Living   0             SAB TAB Ectopic Multiple Live Births                   Home Medications    Prior to Admission medications   Medication Sig Start Date End Date Taking? Authorizing Provider  doxycycline (VIBRAMYCIN) 100 MG capsule Take 1 capsule (100 mg total) by mouth 2 (two) times daily. 12/27/16   Jacqualine Mau, NP  predniSONE (DELTASONE) 20 MG tablet 2 tabs po daily x 4 days 12/29/16   Deno Etienne, DO    Family History Family History  Problem  Relation Age of Onset  . Migraines Mother   . Stroke Paternal Grandmother     Social History Social History  Substance Use Topics  . Smoking status: Never Smoker  . Smokeless tobacco: Never Used  . Alcohol use Yes     Comment: socially     Allergies   Other   Review of Systems Review of Systems  Constitutional: Negative for chills and fever.  HENT: Negative for congestion and rhinorrhea.   Eyes: Negative for redness and visual disturbance.  Respiratory: Negative for shortness of breath and wheezing.   Cardiovascular: Negative for chest pain and palpitations.  Gastrointestinal: Negative for nausea and vomiting.  Genitourinary: Negative for dysuria and urgency.  Musculoskeletal: Negative for arthralgias and myalgias.  Skin: Positive for rash. Negative for pallor and wound.  Neurological: Negative for dizziness and headaches.     Physical Exam Updated Vital Signs BP 132/96   Pulse 83   Temp 97.9 F (36.6 C) (Oral)   Resp 16   SpO2 96%   Physical Exam  Constitutional: She is oriented to person, place, and time. She appears well-developed and well-nourished. No distress.  HENT:  Head: Normocephalic and atraumatic.  Eyes: EOM are normal. Pupils are equal, round, and reactive to light.  Neck: Normal range of motion. Neck supple.  Cardiovascular: Normal rate and regular rhythm.  Exam reveals no gallop and no friction rub.   No murmur heard. Pulmonary/Chest: Effort normal. She has no wheezes. She has no rales.  Abdominal: Soft. She exhibits no distension. There is no tenderness.  Musculoskeletal: She exhibits no edema or tenderness.  Neurological: She is alert and oriented to person, place, and time.  Skin: Skin is warm and dry. She is not diaphoretic.  Diffuse hives, worse to the trunk  Psychiatric: She has a normal mood and affect. Her behavior is normal.  Nursing note and vitals reviewed.    ED Treatments / Results  Labs (all labs ordered are listed, but only  abnormal results are displayed) Labs Reviewed - No data to display  EKG  EKG Interpretation None       Radiology No results found.  Procedures Procedures (including critical care time)  Medications Ordered in ED Medications  diphenhydrAMINE (BENADRYL) capsule 50 mg (50 mg Oral Given 12/29/16 0441)  famotidine (PEPCID) tablet 40 mg (40 mg Oral Given 12/29/16 0441)  predniSONE (DELTASONE) tablet 60 mg (60 mg Oral Given 12/29/16 0443)     Initial Impression / Assessment and Plan / ED Course  I have reviewed the triage vital signs and the nursing notes.  Pertinent labs & imaging results that were available during my care of the patient were reviewed by me and considered in my medical decision making (see chart for details).  Clinical Course     21 yo F With hives. No signs of anaphylaxis. Will give prednisone and Benadryl Pepcid. Reassess.  Some improvement, d/c home.   6:12 AM:  I have discussed the diagnosis/risks/treatment options with the patient and family and believe the pt to be eligible for discharge home to follow-up with PCP. We also discussed returning to the ED immediately if new or worsening sx occur. We discussed the sx which are most concerning (e.g., sob, wheezing, diarrhea, vomiting, syncope) that necessitate immediate return. Medications administered to the patient during their visit and any new prescriptions provided to the patient are listed below.  Medications given during this visit Medications  diphenhydrAMINE (BENADRYL) capsule 50 mg (50 mg Oral Given 12/29/16 0441)  famotidine (PEPCID) tablet 40 mg (40 mg Oral Given 12/29/16 0441)  predniSONE (DELTASONE) tablet 60 mg (60 mg Oral Given 12/29/16 0443)     The patient appears reasonably screen and/or stabilized for discharge and I doubt any other medical condition or other [facility] requiring further screening, evaluation, or treatment in the ED at this time prior to discharge.    Final Clinical Impressions(s) / ED  Diagnoses   Final diagnoses:  Hives    New Prescriptions Discharge Medication List as of 12/29/2016  5:38 AM    START taking these medications   Details  predniSONE (DELTASONE) 20 MG tablet 2 tabs po daily x 4 days, Print         Deno Etienne, DO 12/29/16 [id number]

## 2016-12-29 NOTE — ED Triage Notes (Signed)
Pt woke up this morning with hives and a rash. Denies new lotions, perfumes, or foods. Pt also reports mild abdominal discomfort

## 2017-04-08 ENCOUNTER — Encounter (HOSPITAL_COMMUNITY): Payer: Self-pay | Admitting: *Deleted

## 2017-04-08 DIAGNOSIS — R1011 Right upper quadrant pain: Secondary | ICD-10-CM | POA: Diagnosis present

## 2017-04-08 LAB — COMPREHENSIVE METABOLIC PANEL
ALT: 34 U/L (ref 14–54)
AST: 29 U/L (ref 15–41)
Albumin: 4.5 g/dL (ref 3.5–5.0)
Alkaline Phosphatase: 90 U/L (ref 38–126)
Anion gap: 6 (ref 5–15)
BUN: 10 mg/dL (ref 6–20)
CHLORIDE: 107 mmol/L (ref 101–111)
CO2: 28 mmol/L (ref 22–32)
Calcium: 9.3 mg/dL (ref 8.9–10.3)
Creatinine, Ser: 0.79 mg/dL (ref 0.44–1.00)
Glucose, Bld: 91 mg/dL (ref 65–99)
POTASSIUM: 4.2 mmol/L (ref 3.5–5.1)
SODIUM: 141 mmol/L (ref 135–145)
Total Bilirubin: 0.5 mg/dL (ref 0.3–1.2)
Total Protein: 7.9 g/dL (ref 6.5–8.1)

## 2017-04-08 LAB — URINALYSIS, ROUTINE W REFLEX MICROSCOPIC
Bilirubin Urine: NEGATIVE
GLUCOSE, UA: NEGATIVE mg/dL
Hgb urine dipstick: NEGATIVE
Ketones, ur: NEGATIVE mg/dL
LEUKOCYTES UA: NEGATIVE
Nitrite: NEGATIVE
PH: 8 (ref 5.0–8.0)
Protein, ur: NEGATIVE mg/dL
SPECIFIC GRAVITY, URINE: 1.013 (ref 1.005–1.030)

## 2017-04-08 LAB — CBC
HEMATOCRIT: 41.9 % (ref 36.0–46.0)
Hemoglobin: 13.6 g/dL (ref 12.0–15.0)
MCH: 30.3 pg (ref 26.0–34.0)
MCHC: 32.5 g/dL (ref 30.0–36.0)
MCV: 93.3 fL (ref 78.0–100.0)
Platelets: 292 10*3/uL (ref 150–400)
RBC: 4.49 MIL/uL (ref 3.87–5.11)
RDW: 12.1 % (ref 11.5–15.5)
WBC: 8.9 10*3/uL (ref 4.0–10.5)

## 2017-04-08 LAB — POC URINE PREG, ED: Preg Test, Ur: NEGATIVE

## 2017-04-08 LAB — LIPASE, BLOOD: Lipase: 28 U/L (ref 11–51)

## 2017-04-08 MED ORDER — ONDANSETRON 4 MG PO TBDP
4.0000 mg | ORAL_TABLET | Freq: Once | ORAL | Status: AC | PRN
  Administered 2017-04-08: 4 mg via ORAL
  Filled 2017-04-08: qty 1

## 2017-04-08 NOTE — ED Triage Notes (Signed)
Pt complains of right lower abdomen/flank pain and nausea for the past few hours. Pt denies emesis or diarrhea. Pt denies urinary symptoms.

## 2017-04-09 ENCOUNTER — Emergency Department (HOSPITAL_COMMUNITY)
Admission: EM | Admit: 2017-04-09 | Discharge: 2017-04-09 | Disposition: A | Discharge status: Home / Self Care | Payer: 59 | Attending: Emergency Medicine | Admitting: Emergency Medicine

## 2017-04-09 ENCOUNTER — Emergency Department (HOSPITAL_COMMUNITY): Payer: 59

## 2017-04-09 ENCOUNTER — Encounter (HOSPITAL_COMMUNITY): Payer: Self-pay

## 2017-04-09 DIAGNOSIS — R1011 Right upper quadrant pain: Secondary | ICD-10-CM

## 2017-04-09 MED ORDER — DICYCLOMINE HCL 20 MG PO TABS: 20.0000 mg | ORAL_TABLET | Freq: Two times a day (BID) | ORAL | 0 refills | Status: AC

## 2017-04-09 MED ORDER — OMEPRAZOLE 20 MG PO CPDR: 20.0000 mg | DELAYED_RELEASE_CAPSULE | Freq: Every day | ORAL | 0 refills | Status: AC

## 2017-04-09 MED ORDER — ONDANSETRON HCL 4 MG/2ML IJ SOLN
4.0000 mg | Freq: Once | INTRAMUSCULAR | Status: AC
  Administered 2017-04-09: 4 mg via INTRAVENOUS
  Filled 2017-04-09: qty 2

## 2017-04-09 MED ORDER — HYDROMORPHONE HCL 2 MG/ML IJ SOLN
1.0000 mg | Freq: Once | INTRAMUSCULAR | Status: AC
  Administered 2017-04-09: 1 mg via INTRAVENOUS
  Filled 2017-04-09: qty 1

## 2017-04-09 NOTE — ED Provider Notes (Signed)
[facility] DEPT Provider Note   CSN: [id number] Arrival date & time: 04/08/17  2203     History   Chief Complaint Chief Complaint  Patient presents with  . Abdominal Pain    HPI Alejandra Martinez is a 21 y.o. female.  Patient with no past surgical history, presents with complaint of abdominal pain. Patient has had intermittent right upper quadrant abdominal pains with radiation to her back, worse with food, over the past 3 days. Yesterday the patient's pain became constant and more severe. She has had associated nausea and constipation. No vomiting or fevers. She is not taking any medications and has tried to "tough it out". Prior to 3 days ago she has not had any similar symptoms. No chest pain or cough. Pain radiates to her right mid back. No urinary symptoms. No history of heavy NSAID use or recent alcohol use. The onset of this condition was acute. The course is constant. Aggravating factors: palpation. Alleviating factors: none.        Past Medical History:  Diagnosis Date  . Anemia   . Anxiety   . Dysmenorrhea   . History of broken nose    hairline fracture  . Migraines     Patient Active Problem List   Diagnosis Date Noted  . Lump of right breast 10/19/2013    Past Surgical History:  Procedure Laterality Date  . BREAST LUMPECTOMY Right 09/23/2016   Procedure: RIGHT BREAST LUMPECTOMY;  Surgeon: Donnie Mesa, MD;  Location: [facility];  Service: General;  Laterality: Right;  . WISDOM TOOTH EXTRACTION  07/2013    OB History    Gravida Para Term Preterm AB Living   0             SAB TAB Ectopic Multiple Live Births                   Home Medications    Prior to Admission medications   Medication Sig Start Date End Date Taking? Authorizing Provider  Prenatal Vit-Fe Fumarate-FA (PRENATAL MULTIVITAMIN) TABS tablet Take 1 tablet by mouth daily at 12 noon.   Yes Historical Provider, MD  Vitamin D, Ergocalciferol, (DRISDOL) 50000 units CAPS capsule Take 50,000  Units by mouth 2 (two) times a week.   Yes Historical Provider, MD  doxycycline (VIBRAMYCIN) 100 MG capsule Take 1 capsule (100 mg total) by mouth 2 (two) times daily. Patient not taking: Reported on 04/09/2017 12/27/16   Jacqualine Mau, NP  predniSONE (DELTASONE) 20 MG tablet 2 tabs po daily x 4 days Patient not taking: Reported on 04/09/2017 12/29/16   Deno Etienne, DO    Family History Family History  Problem Relation Age of Onset  . Migraines Mother   . Stroke Paternal Grandmother     Social History Social History  Substance Use Topics  . Smoking status: Never Smoker  . Smokeless tobacco: Never Used  . Alcohol use Yes     Comment: socially     Allergies   Other   Review of Systems Review of Systems  Constitutional: Negative for fever.  HENT: Negative for rhinorrhea and sore throat.   Eyes: Negative for redness.  Respiratory: Negative for cough.   Cardiovascular: Negative for chest pain.  Gastrointestinal: Positive for abdominal pain and nausea. Negative for diarrhea and vomiting.  Genitourinary: Negative for dysuria.  Musculoskeletal: Positive for back pain. Negative for myalgias.  Skin: Negative for rash.  Neurological: Negative for headaches.     Physical Exam Updated Vital Signs BP Marland Kitchen)  140/96   Pulse 68   Temp 97.5 F (36.4 C) (Oral)   Resp 18   Ht 5\' 3"  (1.6 m)   Wt 92.5 kg   LMP 03/13/2017   SpO2 100%   BMI 36.14 kg/m   Physical Exam  Constitutional: She appears well-developed and well-nourished. She appears distressed.  HENT:  Head: Normocephalic and atraumatic.  Mouth/Throat: Oropharynx is clear and moist.  Eyes: Conjunctivae are normal. Right eye exhibits no discharge. Left eye exhibits no discharge.  Neck: Normal range of motion. Neck supple.  Cardiovascular: Normal rate, regular rhythm and normal heart sounds.   Pulmonary/Chest: Effort normal and breath sounds normal. No respiratory distress. She has no wheezes. She has no rales.    Abdominal: Soft. She exhibits no mass. There is tenderness (Right upper quadrant moderate, otherwise generalized mild). There is guarding. There is no rebound.  Neurological: She is alert.  Skin: Skin is warm and dry.  Psychiatric: She has a normal mood and affect.  Nursing note and vitals reviewed.    ED Treatments / Results  Labs (all labs ordered are listed, but only abnormal results are displayed) Labs Reviewed  LIPASE, BLOOD  COMPREHENSIVE METABOLIC PANEL  CBC  URINALYSIS, ROUTINE W REFLEX MICROSCOPIC  POC URINE PREG, ED    Radiology Ct Renal Stone Study  Result Date: 04/09/2017 CLINICAL DATA:  Acute onset of right flank pain and nausea. Initial encounter. EXAM: CT ABDOMEN AND PELVIS WITHOUT CONTRAST TECHNIQUE: Multidetector CT imaging of the abdomen and pelvis was performed following the standard protocol without IV contrast. COMPARISON:  Right upper quadrant ultrasound performed earlier today at 3:56 a.m. FINDINGS: Lower chest: Mild bibasilar atelectasis is noted. The visualized portions of the mediastinum are unremarkable. Hepatobiliary: The liver is unremarkable in appearance. The gallbladder is unremarkable in appearance. The common bile duct remains normal in caliber. Pancreas: The pancreas is within normal limits. Spleen: The spleen is unremarkable in appearance. Adrenals/Urinary Tract: The adrenal glands are unremarkable in appearance. The kidneys are within normal limits. There is no evidence of hydronephrosis. No renal or ureteral stones are identified. No perinephric stranding is seen. Stomach/Bowel: The stomach is unremarkable in appearance. The small bowel is within normal limits. The appendix is normal in caliber, without evidence of appendicitis. The colon is unremarkable in appearance. Vascular/Lymphatic: The abdominal aorta is unremarkable in appearance. The inferior vena cava is grossly unremarkable. No retroperitoneal lymphadenopathy is seen. No pelvic sidewall  lymphadenopathy is identified. Reproductive: The bladder is mildly distended and within normal limits. The uterus is grossly unremarkable in appearance. The ovaries are relatively symmetric. No suspicious adnexal masses are seen. Other: No additional soft tissue abnormalities are seen. Musculoskeletal: No acute osseous abnormalities are identified. The visualized musculature is unremarkable in appearance. IMPRESSION: 1. No acute abnormality seen within the abdomen and pelvis. 2. Mild bibasilar atelectasis noted. Electronically Signed   By: Garald Balding M.D.   On: 04/09/2017 05:18   US Abdomen Limited Ruq  Result Date: 04/09/2017 CLINICAL DATA:  Right upper quadrant abdominal pain EXAM: US ABDOMEN LIMITED - RIGHT UPPER QUADRANT COMPARISON:  None. FINDINGS: Gallbladder: No gallstones or wall thickening visualized. No sonographic Murphy sign noted by sonographer. Common bile duct: Diameter: 2.5 mm, normal Liver: No focal lesion identified. Within normal limits in parenchymal echogenicity. IMPRESSION: Normal right upper quadrant ultrasound. Electronically Signed   By: Ulyses Jarred M.D.   On: 04/09/2017 04:35    Procedures Procedures (including critical care time)  Medications Ordered in ED Medications  ondansetron (  ZOFRAN-ODT) disintegrating tablet 4 mg (4 mg Oral Given 04/08/17 2211)  HYDROmorphone (DILAUDID) injection 1 mg (1 mg Intravenous Given 04/09/17 0350)  ondansetron (ZOFRAN) injection 4 mg (4 mg Intravenous Given 04/09/17 0350)     Initial Impression / Assessment and Plan / ED Course  I have reviewed the triage vital signs and the nursing notes.  Pertinent labs & imaging results that were available during my care of the patient were reviewed by me and considered in my medical decision making (see chart for details).     Patient seen and examined. Work-up initiated. Medications ordered. Initial symptoms concerning for biliary colic. If negative, will consider CT scan. Patient does  appear very uncomfortable and has significant tenderness worse in the right upper quadrant.  Vital signs reviewed and are as follows: BP (!) 140/96   Pulse 68   Temp 97.5 F (36.4 C) (Oral)   Resp 18   Ht 5\' 3"  (1.6 m)   Wt 92.5 kg   LMP 03/13/2017   SpO2 100%   BMI 36.14 kg/m   Ultrasound was negative. Renal protocol CT ordered.  5:45 AM patient updated on results. Her pain remains well controlled in emergency department. She is tolerating oral fluids without vomiting. At this point, given normal labs and negative imaging, feel patient is safe for discharge home. Will discharge to home with omeprazole and Bentyl. PCP referral given.  The patient was urged to return to the Emergency Department immediately with worsening of current symptoms, worsening abdominal pain, persistent vomiting, blood noted in stools, fever, or any other concerns. The patient verbalized understanding.    Final Clinical Impressions(s) / ED Diagnoses   Final diagnoses:  RUQ abdominal pain  Right upper quadrant abdominal pain   Patient with right upper quadrant abdominal pain. Labs including liver enzymes are unremarkable. Symptoms controlled in emergency department with IV medications. Urine clear. CT ordered to rule out ureteral stone. This was negative for this and other findings. Unclear etiology of symptoms at this time. Will discharge to home with Bentyl and omeprazole. Return instructions as above.  Vitals are stable, no fever. No signs of dehydration, patient is tolerating PO's. Lungs are clear and no signs suggestive of PNA. Low concern for appendicitis, cholecystitis, pancreatitis, ruptured viscus, UTI, kidney stone, aortic dissection, aortic aneurysm or other emergent abdominal etiology. Supportive therapy indicated with return if symptoms worsen.    New Prescriptions New Prescriptions   DICYCLOMINE (BENTYL) 20 MG TABLET    Take 1 tablet (20 mg total) by mouth 2 (two) times daily.   OMEPRAZOLE  (PRILOSEC) 20 MG CAPSULE    Take 1 capsule (20 mg total) by mouth daily.     Carlisle Cater, PA-C 04/09/17 [place], MD 04/09/17 [phone]

## 2017-04-09 NOTE — ED Notes (Signed)
Pt is tearful in pain and states that MD has not made a visit at this time.

## 2017-04-09 NOTE — ED Notes (Signed)
PT states that pain worsens after eating. Pt does report not having a bm x 2 days. No diarrhea, no vomiting . + nausea.

## 2017-04-09 NOTE — ED Notes (Signed)
Bed: WA05 Expected date:  Expected time:  Means of arrival:  Comments: 

## 2017-04-09 NOTE — Discharge Instructions (Signed)
Please read and follow all provided instructions.  Your diagnoses today include:  1. Right upper quadrant abdominal pain   2. RUQ abdominal pain     Tests performed today include:  Blood counts and electrolytes  Blood tests to check liver and kidney function  Blood tests to check pancreas function  Urine test to look for infection and pregnancy (in women)  Ultrasound shows a normal gallbladder  CT scan without any problems  Vital signs. See below for your results today.   Medications prescribed:   Bentyl - medication for intestinal cramps and spasms   Omeprazole (Prilosec) - stomach acid reducer  This medication can be found over-the-counter  Take any prescribed medications only as directed.  Home care instructions:   Follow any educational materials contained in this packet.  Follow-up instructions: Please follow-up with your primary care provider in the next 7 days for further evaluation of your symptoms.    Return instructions:  SEEK IMMEDIATE MEDICAL ATTENTION IF:  The pain does not go away or becomes severe   A temperature above 101F develops   Repeated vomiting occurs (multiple episodes)   The pain becomes localized to portions of the abdomen. The right side could possibly be appendicitis. In an adult, the left lower portion of the abdomen could be colitis or diverticulitis.   Blood is being passed in stools or vomit (bright red or black tarry stools)   You develop chest pain, difficulty breathing, dizziness or fainting, or become confused, poorly responsive, or inconsolable (young children)  If you have any other emergent concerns regarding your health  Additional Information: Abdominal (belly) pain can be caused by many things. Your caregiver performed an examination and possibly ordered blood/urine tests and imaging (CT scan, x-rays, ultrasound). Many cases can be observed and treated at home after initial evaluation in the emergency department.  Even though you are being discharged home, abdominal pain can be unpredictable. Therefore, you need a repeated exam if your pain does not resolve, returns, or worsens. Most patients with abdominal pain don't have to be admitted to the hospital or have surgery, but serious problems like appendicitis and gallbladder attacks can start out as nonspecific pain. Many abdominal conditions cannot be diagnosed in one visit, so follow-up evaluations are very important.  Your vital signs today were: BP (!) 140/96    Pulse 68    Temp 97.5 F (36.4 C) (Oral)    Resp 18    Ht 5\' 3"  (1.6 m)    Wt 92.5 kg    LMP 03/13/2017 Comment: negative urine pregnancy test 04/08/17   SpO2 100%    BMI 36.14 kg/m  If your blood pressure (bp) was elevated above 135/85 this visit, please have this repeated by your doctor within one month. -------------- ]

## 2017-12-19 IMAGING — CT CT RENAL STONE PROTOCOL
2 of 3 series · 16 of 46 positions shown, 18 images · non-contrast
Comparison: Right upper quadrant ultrasound performed earlier today
at [DATE] a.m.

CLINICAL DATA: Acute onset of right flank pain and nausea. Initial
encounter.

EXAM:
CT ABDOMEN AND PELVIS WITHOUT CONTRAST
TECHNIQUE: Multidetector CT imaging of the abdomen and pelvis was performed
following the standard protocol without IV contrast.

[Series 3: lung · axial · 0.64mm/px · z∈[+192,+260]mm · 13 of 40 slices shown, 15 images]
[im 3/40  soft-tissue]
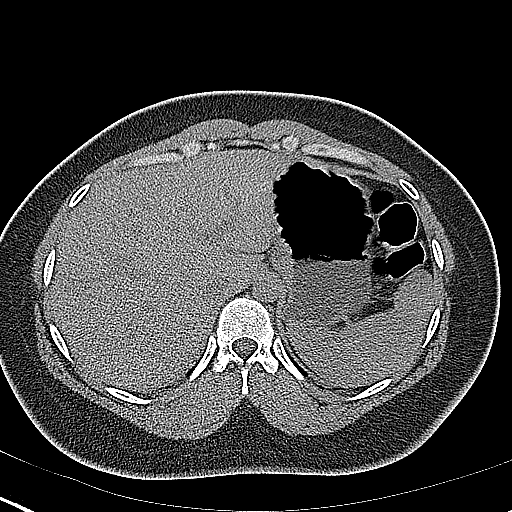
[im 3/40  bone]
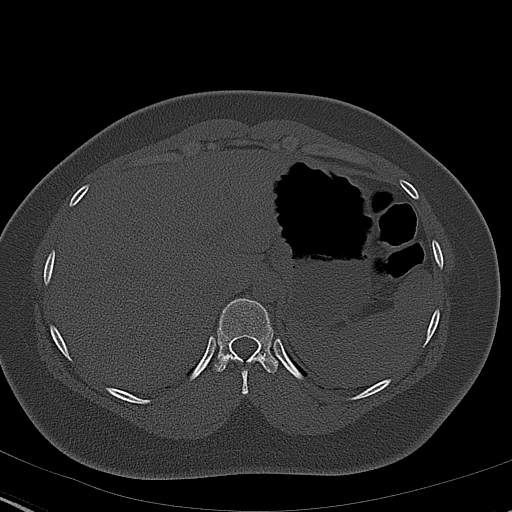
[im 6/40  soft-tissue]
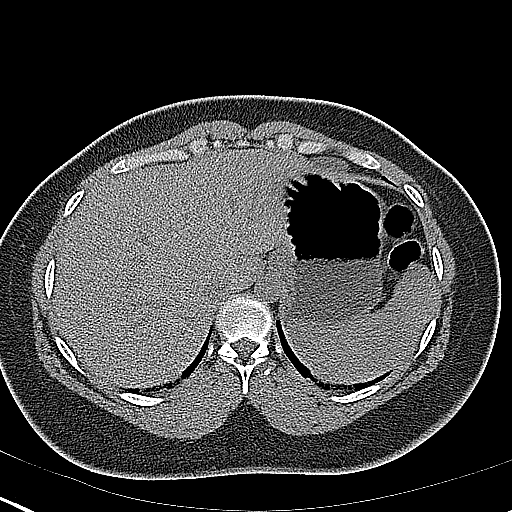
[im 8/40  soft-tissue]
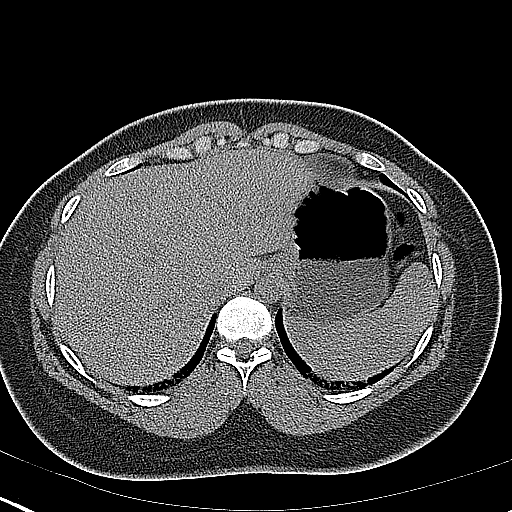
[im 12/40  soft-tissue]
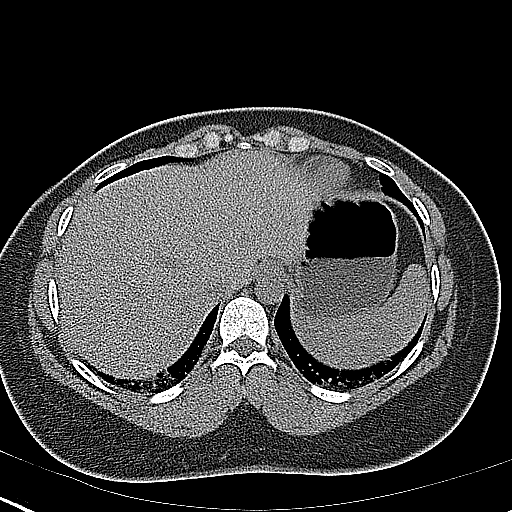
[im 14/40  soft-tissue]
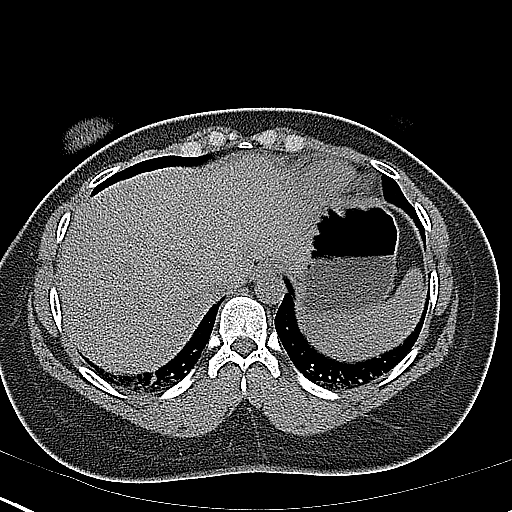
[im 17/40  soft-tissue]
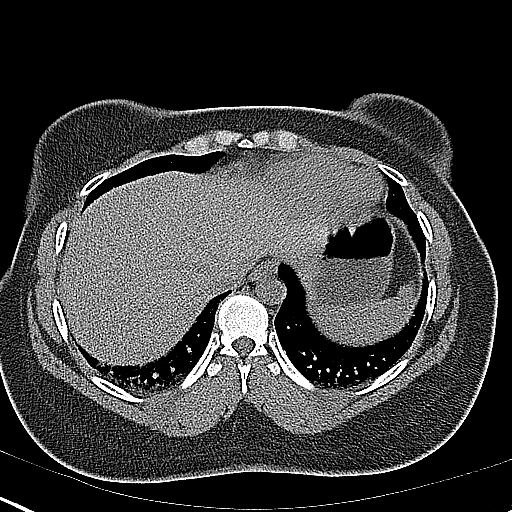
[im 21/40  soft-tissue]
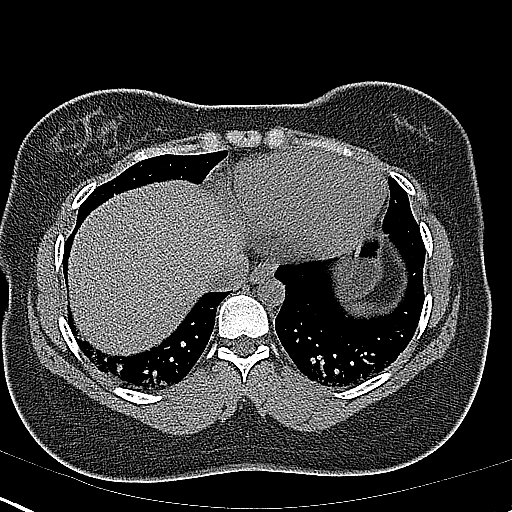
[im 23/40  soft-tissue]
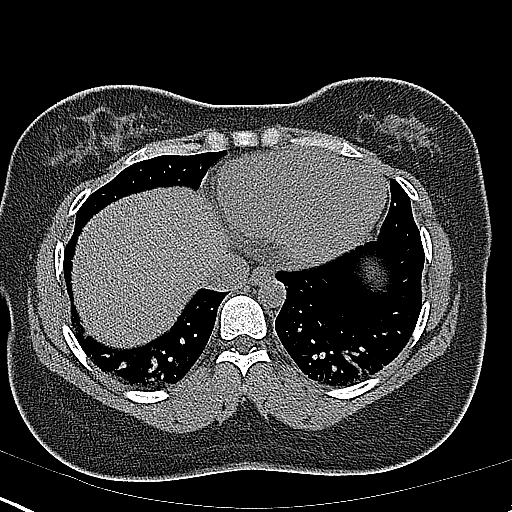
[im 26/40  soft-tissue]
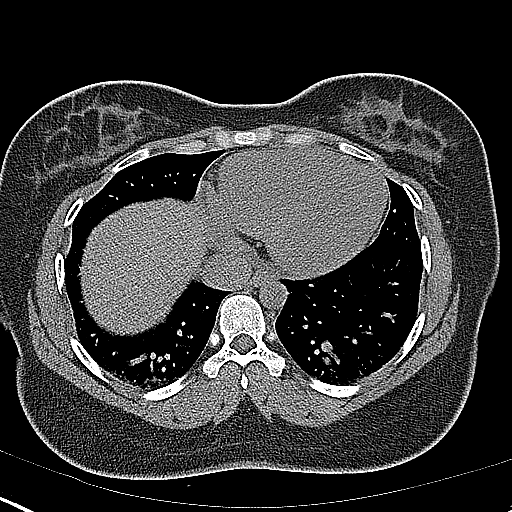
[im 26/40  bone]
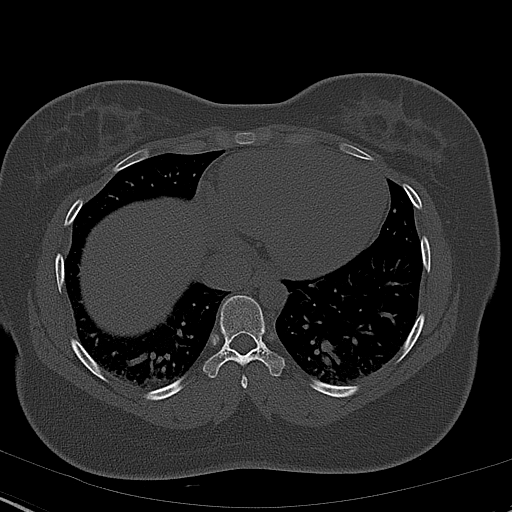
[im 28/40  soft-tissue]
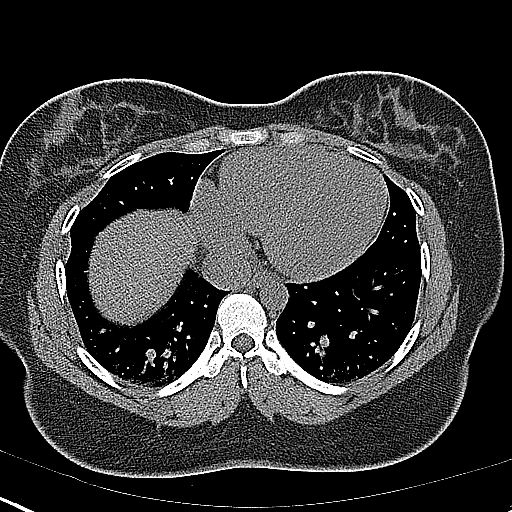
[im 32/40  soft-tissue]
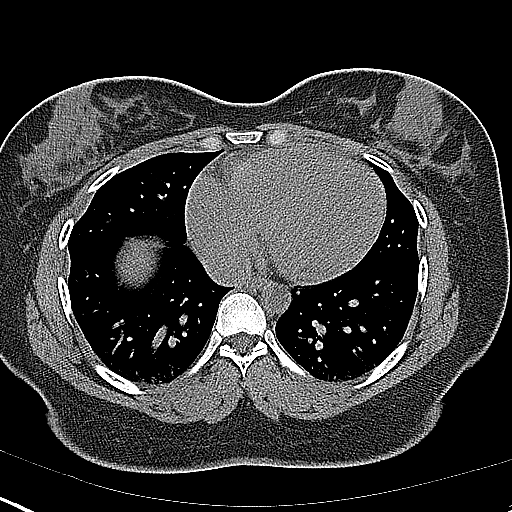
[im 34/40  soft-tissue]
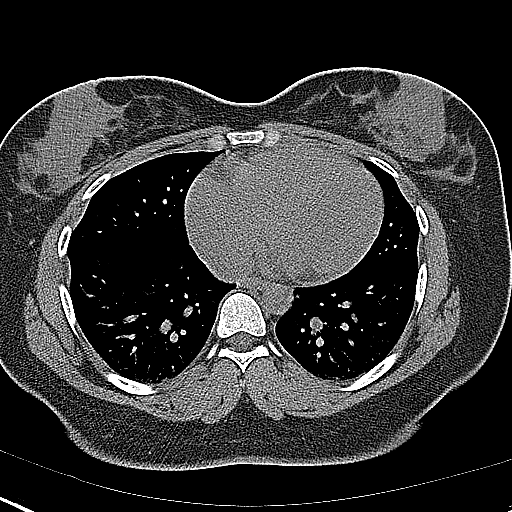
[im 37/40  soft-tissue]
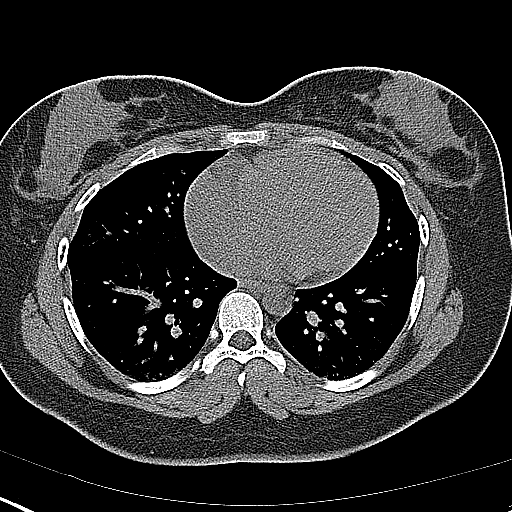

[Series 4: coronal · coronal · 0.65mm/px · 3 of 123 slices shown]
[im 41/123  soft-tissue]
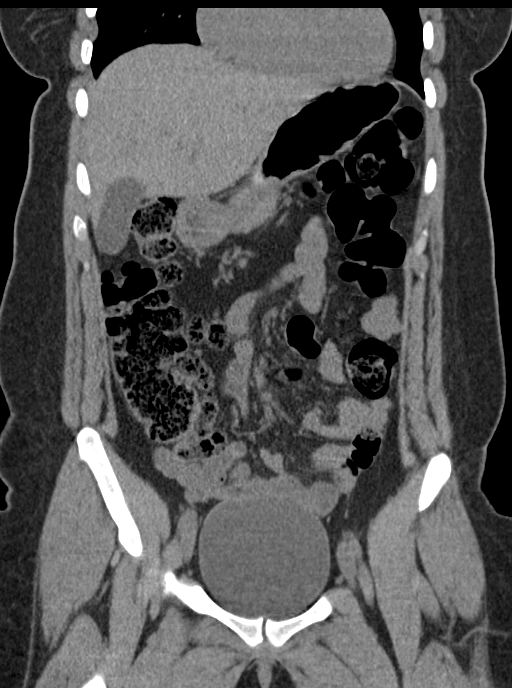
[im 55/123  soft-tissue]
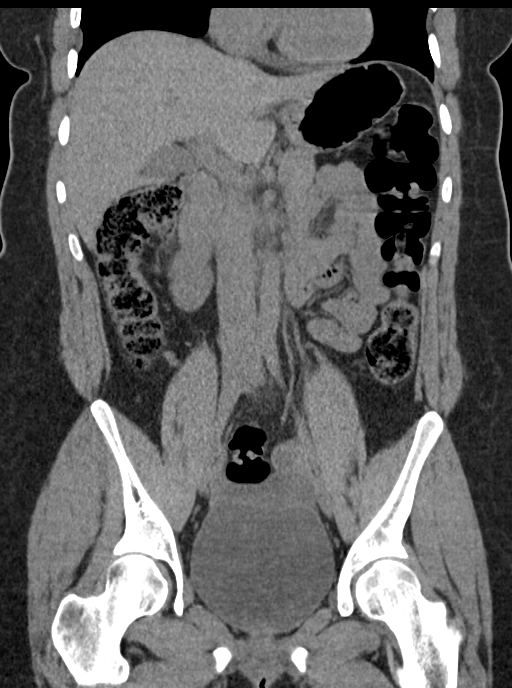
[im 68/123  soft-tissue]
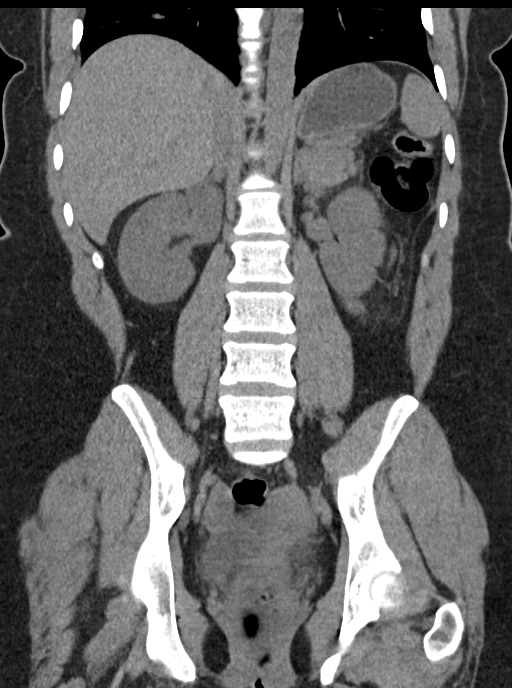

[16 of 46 positions shown; findings below may reference images not displayed]

FINDINGS: Lower chest: Mild bibasilar atelectasis is noted. The visualized
portions of the mediastinum are unremarkable.

Hepatobiliary: The liver is unremarkable in appearance. The
gallbladder is unremarkable in appearance. The common bile duct
remains normal in caliber.

Pancreas: The pancreas is within normal limits.

Spleen: The spleen is unremarkable in appearance.

Adrenals/Urinary Tract: The adrenal glands are unremarkable in
appearance. The kidneys are within normal limits. There is no
evidence of hydronephrosis. No renal or ureteral stones are
identified. No perinephric stranding is seen.

Stomach/Bowel: The stomach is unremarkable in appearance. The small
bowel is within normal limits. The appendix is normal in caliber,
without evidence of appendicitis. The colon is unremarkable in
appearance.

Vascular/Lymphatic: The abdominal aorta is unremarkable in
appearance. The inferior vena cava is grossly unremarkable. No
retroperitoneal lymphadenopathy is seen. No pelvic sidewall
lymphadenopathy is identified.

Reproductive: The bladder is mildly distended and within normal
limits. The uterus is grossly unremarkable in appearance. The
ovaries are relatively symmetric. No suspicious adnexal masses are
seen.

Other: No additional soft tissue abnormalities are seen.

Musculoskeletal: No acute osseous abnormalities are identified. The
visualized musculature is unremarkable in appearance.
IMPRESSION: 1. No acute abnormality seen within the abdomen and pelvis.
2. Mild bibasilar atelectasis noted.
